# Patient Record
Sex: Female | Born: 1935
Health system: Southern US, Community
[De-identification: ages and names within clinical notes are randomized; demographics above are authoritative.]

## PROBLEM LIST (undated history)

## (undated) HISTORY — PX: OTHER SURGICAL HISTORY: SHX169

## (undated) HISTORY — PX: FRACTURE SURGERY: SHX138

---

## 2017-10-10 ENCOUNTER — Ambulatory Visit: Payer: Self-pay | Admitting: Family Medicine

## 2017-10-10 ENCOUNTER — Encounter: Payer: Self-pay | Admitting: Family Medicine

## 2017-10-10 VITALS — BP 110/80 | HR 85 | Ht 60.0 in | Wt 142.0 lb

## 2017-10-10 DIAGNOSIS — H6121 Impacted cerumen, right ear: Secondary | ICD-10-CM

## 2017-10-10 NOTE — Progress Notes (Signed)
Subjective:  Rachel Hughes is a 83 y.o. female who presents today with a chief complaint of earwax impaction and to establish care.   HPI:  Patient currently resides part time in [place]. She is visiting here in [place] with her daughter. Patient's husband recently passed away, and the patient will be spending more time in [place].  Ear Wax Impaction, New Issue Patient had evaluation for hearing aid last week. Was told that she had ear wax impaction that needed to be irrigated. Hearing has been stable over that time. She has had decreased hearing for several years. No home treatments tried. No clear precipitating events. No obvious alleviating or aggravating factors.   Nocturnal Cramps, New Issue Patient also with occasional nocturnal leg cramps. No clear precipitating factors. No home treatments tried.   ROS: Per HPI, otherwise a 14 point review of systems was performed and was negative  PMH:  The following were reviewed and entered/updated in epic: History reviewed. No pertinent past medical history. There are no active problems to display for this patient.  Past Surgical History:  Procedure Laterality Date  . CESAREAN SECTION    . Left Hip Surgery Left     Family History  Problem Relation Age of Onset  . Arthritis Daughter   . Arthritis Son   . Heart disease Son     Medications- reviewed and updated No current outpatient medications on file.   No current facility-administered medications for this visit.     Allergies-reviewed and updated No Known Allergies  Social History   Socioeconomic History  . Marital status: Widowed    Spouse name: None  . Number of children: 2  . Years of education: None  . Highest education level: None  Social Needs  . Financial resource strain: None  . Food insecurity - worry: None  . Food insecurity - inability: None  . Transportation needs - medical: None  . Transportation needs - non-medical: None  Occupational  History  . Occupation: Retired  Tobacco Use  . Smoking status: Never Smoker  . Smokeless tobacco: Never Used  Substance and Sexual Activity  . Alcohol use: No    Frequency: Never  . Drug use: No  . Sexual activity: No  Other Topics Concern  . None  Social History Narrative  . None   Objective:  Physical Exam: BP 110/80   Pulse 85   Ht 5' (1.524 m)   Wt 142 lb (64.4 kg)   SpO2 98%   BMI 27.73 kg/m   Gen: NAD, resting comfortably HEENT: Left TM clear. Right EAC with cerumen, after irrigation TM visualized without abnormality.  CV: RRR with no murmurs appreciated Pulm: NWOB, CTAB with no crackles, wheezes, or rhonchi GI: Normal bowel sounds present. Soft, Nontender, Nondistended. MSK: No edema, cyanosis, or clubbing noted Skin: Warm, dry Neuro: Grossly normal, moves all extremities Psych: Normal affect and thought content  Right EAC irrigated successfully by CMA. TM then visualized without abnormality.   Assessment/Plan:  Cerumen Impaction Successfully irrigated today. Advised patient to use hydrogen peroxide for 5-10 minutes at a time a few times a week to prevent recurrence. Patient will be going to having hearing checked again with possible hearing aid fit.   Nocturnal Cramps Likely related to dehydration. Offered laboratory testing today including CBC, CMET, and TSH, however patient and family declined. Encouraged good oral hydration.   Preventative Healthcare Flu shot deferred. Per daughter, patient recently had normal cholesterol panel in [place]. Will repeat again within  the next year.   Algis Greenhouse. Jerline Pain, MD 10/10/2017 3:37 PM

## 2019-06-21 ENCOUNTER — Emergency Department (HOSPITAL_COMMUNITY): Payer: Self-pay

## 2019-06-21 ENCOUNTER — Encounter (HOSPITAL_COMMUNITY): Payer: Self-pay | Admitting: Emergency Medicine

## 2019-06-21 ENCOUNTER — Inpatient Hospital Stay (HOSPITAL_COMMUNITY)
Admission: EM | Admit: 2019-06-21 | Discharge: 2019-07-01 | DRG: 418 | Disposition: A | Discharge status: Home Health | Payer: Self-pay | Attending: Internal Medicine | Admitting: Internal Medicine

## 2019-06-21 ENCOUNTER — Other Ambulatory Visit: Payer: Self-pay

## 2019-06-21 DIAGNOSIS — N390 Urinary tract infection, site not specified: Secondary | ICD-10-CM | POA: Diagnosis present

## 2019-06-21 DIAGNOSIS — R0789 Other chest pain: Secondary | ICD-10-CM

## 2019-06-21 DIAGNOSIS — H269 Unspecified cataract: Secondary | ICD-10-CM | POA: Diagnosis present

## 2019-06-21 DIAGNOSIS — K828 Other specified diseases of gallbladder: Secondary | ICD-10-CM

## 2019-06-21 DIAGNOSIS — H9190 Unspecified hearing loss, unspecified ear: Secondary | ICD-10-CM | POA: Diagnosis present

## 2019-06-21 DIAGNOSIS — K5900 Constipation, unspecified: Secondary | ICD-10-CM | POA: Diagnosis present

## 2019-06-21 DIAGNOSIS — I441 Atrioventricular block, second degree: Secondary | ICD-10-CM | POA: Diagnosis not present

## 2019-06-21 DIAGNOSIS — C23 Malignant neoplasm of gallbladder: Principal | ICD-10-CM | POA: Diagnosis present

## 2019-06-21 DIAGNOSIS — R109 Unspecified abdominal pain: Secondary | ICD-10-CM

## 2019-06-21 DIAGNOSIS — K8689 Other specified diseases of pancreas: Secondary | ICD-10-CM

## 2019-06-21 DIAGNOSIS — N6320 Unspecified lump in the left breast, unspecified quadrant: Secondary | ICD-10-CM

## 2019-06-21 DIAGNOSIS — C779 Secondary and unspecified malignant neoplasm of lymph node, unspecified: Secondary | ICD-10-CM | POA: Diagnosis present

## 2019-06-21 DIAGNOSIS — Z789 Other specified health status: Secondary | ICD-10-CM

## 2019-06-21 DIAGNOSIS — R1011 Right upper quadrant pain: Secondary | ICD-10-CM

## 2019-06-21 DIAGNOSIS — Z20828 Contact with and (suspected) exposure to other viral communicable diseases: Secondary | ICD-10-CM | POA: Diagnosis present

## 2019-06-21 DIAGNOSIS — K810 Acute cholecystitis: Secondary | ICD-10-CM

## 2019-06-21 DIAGNOSIS — Z8249 Family history of ischemic heart disease and other diseases of the circulatory system: Secondary | ICD-10-CM

## 2019-06-21 DIAGNOSIS — K8010 Calculus of gallbladder with chronic cholecystitis without obstruction: Secondary | ICD-10-CM | POA: Diagnosis present

## 2019-06-21 DIAGNOSIS — K219 Gastro-esophageal reflux disease without esophagitis: Secondary | ICD-10-CM | POA: Diagnosis present

## 2019-06-21 DIAGNOSIS — R739 Hyperglycemia, unspecified: Secondary | ICD-10-CM | POA: Diagnosis present

## 2019-06-21 DIAGNOSIS — E876 Hypokalemia: Secondary | ICD-10-CM | POA: Diagnosis not present

## 2019-06-21 DIAGNOSIS — D62 Acute posthemorrhagic anemia: Secondary | ICD-10-CM | POA: Diagnosis not present

## 2019-06-21 DIAGNOSIS — K7581 Nonalcoholic steatohepatitis (NASH): Secondary | ICD-10-CM | POA: Diagnosis present

## 2019-06-21 DIAGNOSIS — C50912 Malignant neoplasm of unspecified site of left female breast: Secondary | ICD-10-CM | POA: Diagnosis present

## 2019-06-21 DIAGNOSIS — C801 Malignant (primary) neoplasm, unspecified: Secondary | ICD-10-CM

## 2019-06-21 DIAGNOSIS — M159 Polyosteoarthritis, unspecified: Secondary | ICD-10-CM | POA: Diagnosis present

## 2019-06-21 DIAGNOSIS — J9811 Atelectasis: Secondary | ICD-10-CM | POA: Diagnosis not present

## 2019-06-21 LAB — COMPREHENSIVE METABOLIC PANEL
ALT: 17 U/L (ref 0–44)
AST: 25 U/L (ref 15–41)
Albumin: 4 g/dL (ref 3.5–5.0)
Alkaline Phosphatase: 66 U/L (ref 38–126)
Anion gap: 8 (ref 5–15)
BUN: 14 mg/dL (ref 8–23)
CO2: 26 mmol/L (ref 22–32)
Calcium: 9.1 mg/dL (ref 8.9–10.3)
Chloride: 103 mmol/L (ref 98–111)
Creatinine, Ser: 0.71 mg/dL (ref 0.44–1.00)
GFR calc Af Amer: >60 mL/min (ref >60)
GFR calc non Af Amer: >60 mL/min (ref >60)
Glucose, Bld: 157 mg/dL — ABNORMAL HIGH (ref 70–99)
Potassium: 3.7 mmol/L (ref 3.5–5.1)
Sodium: 137 mmol/L (ref 135–145)
Total Bilirubin: 0.7 mg/dL (ref 0.3–1.2)
Total Protein: 8.2 g/dL — ABNORMAL HIGH (ref 6.5–8.1)

## 2019-06-21 LAB — URINALYSIS, ROUTINE W REFLEX MICROSCOPIC
Bacteria, UA: NONE SEEN
Bilirubin Urine: NEGATIVE
Glucose, UA: NEGATIVE mg/dL
Ketones, ur: NEGATIVE mg/dL
Nitrite: NEGATIVE
Protein, ur: NEGATIVE mg/dL
Specific Gravity, Urine: 1.006 (ref 1.005–1.030)
pH: 6 (ref 5.0–8.0)

## 2019-06-21 LAB — CBC
HCT: 39.1 % (ref 36.0–46.0)
Hemoglobin: 12.6 g/dL (ref 12.0–15.0)
MCH: 30.1 pg (ref 26.0–34.0)
MCHC: 32.2 g/dL (ref 30.0–36.0)
MCV: 93.5 fL (ref 80.0–100.0)
Platelets: 236 10*3/uL (ref 150–400)
RBC: 4.18 MIL/uL (ref 3.87–5.11)
RDW: 12.9 % (ref 11.5–15.5)
WBC: 8.4 10*3/uL (ref 4.0–10.5)
nRBC: 0 % (ref 0.0–0.2)

## 2019-06-21 LAB — LIPASE, BLOOD: Lipase: 34 U/L (ref 11–51)

## 2019-06-21 MED ORDER — SODIUM CHLORIDE (PF) 0.9 % IJ SOLN
INTRAMUSCULAR | Status: AC
  Filled 2019-06-21: qty 50

## 2019-06-21 MED ORDER — SODIUM CHLORIDE 0.9 % IV BOLUS
500.0000 mL | Freq: Once | INTRAVENOUS | Status: AC
  Administered 2019-06-21: 500 mL via INTRAVENOUS

## 2019-06-21 MED ORDER — SODIUM CHLORIDE 0.9 % IV SOLN
INTRAVENOUS | Status: DC
  Administered 2019-06-22: 02:00:00 via INTRAVENOUS

## 2019-06-21 MED ORDER — IOHEXOL 300 MG/ML  SOLN
100.0000 mL | Freq: Once | INTRAMUSCULAR | Status: AC | PRN
  Administered 2019-06-21: 22:00:00 100 mL via INTRAVENOUS

## 2019-06-21 MED ORDER — ONDANSETRON HCL 4 MG/2ML IJ SOLN
4.0000 mg | Freq: Once | INTRAMUSCULAR | Status: AC
  Administered 2019-06-21: 4 mg via INTRAVENOUS
  Filled 2019-06-21: qty 2

## 2019-06-21 MED ORDER — FENTANYL CITRATE (PF) 100 MCG/2ML IJ SOLN
50.0000 ug | Freq: Once | INTRAMUSCULAR | Status: AC
  Administered 2019-06-21: 50 ug via INTRAVENOUS
  Filled 2019-06-21: qty 2

## 2019-06-21 MED ORDER — ACETAMINOPHEN 325 MG PO TABS
650.0000 mg | ORAL_TABLET | Freq: Once | ORAL | Status: DC
  Filled 2019-06-21: qty 2

## 2019-06-21 MED ORDER — ENOXAPARIN SODIUM 40 MG/0.4ML ~~LOC~~ SOLN
40.0000 mg | SUBCUTANEOUS | Status: DC
  Administered 2019-06-22 – 2019-06-28 (×7): 40 mg via SUBCUTANEOUS
  Filled 2019-06-21 (×7): qty 0.4

## 2019-06-21 MED ORDER — PIPERACILLIN-TAZOBACTAM 3.375 G IVPB 30 MIN
3.3750 g | Freq: Once | INTRAVENOUS | Status: AC
  Administered 2019-06-21: 3.375 g via INTRAVENOUS
  Filled 2019-06-21: qty 50

## 2019-06-21 NOTE — ED Triage Notes (Signed)
Pt c/o abd pains for 3-5 days. Daughter gave her tylenol and Prilosec. Pt hasnt had BM in 3 days or so. No vomiting, or urinary problems.  No [place], daughter interpreting.

## 2019-06-21 NOTE — ED Notes (Signed)
Unable to find pts language on translator, waiting for daughter to return to the ED.

## 2019-06-21 NOTE — ED Notes (Signed)
Pt states that her pain has decreased at the moment, when asked if she would like to hold the tylenol for pain the pt agreed. I explained to the pt if the pain returns to contact us with the call bell and we would arrange something for her.

## 2019-06-21 NOTE — ED Provider Notes (Addendum)
[facility] DEPT Provider Note   CSN: [id number] Arrival date & time: 06/21/19  1232     History   Chief Complaint Chief Complaint  Patient presents with   Abdominal Pain    HPI Rachel Hughes is a 85 y.o. female.     HPI  85 year old female comes in a chief complaint of abdominal pain.  She has no medical history.  She reports that she has been having abdominal discomfort that is fairly constant now for the last 3 to 4 days.  The pain is located primarily in the left upper quadrant.  Her pain is worse with movement.  She also now has nausea without vomiting, anorexia.  There is no UTI-like symptoms, no diarrhea.  Patient has no history of similar pain.  Review of system is also positive for chills, diaphoresis.  History reviewed. No pertinent past medical history.  There are no active problems to display for this patient.   Past Surgical History:  Procedure Laterality Date   CESAREAN SECTION     FRACTURE SURGERY     Left Hip Surgery Left      OB History   No obstetric history on file.      Home Medications    Prior to Admission medications   Medication Sig Start Date End Date Taking? Authorizing Provider  acetaminophen (TYLENOL) 500 MG tablet Take 500-1,000 mg by mouth every 6 (six) hours as needed for mild pain or moderate pain.   Yes [provider]  Calcium Carbonate-Vitamin D (CALCIUM 600+D HIGH POTENCY) 600-400 MG-UNIT tablet Take 1 tablet by mouth daily.   Yes [provider]  Multiple Vitamin (MULTIVITAMIN WITH MINERALS) TABS tablet Take 1 tablet by mouth daily.   Yes [provider]  omeprazole (PRILOSEC OTC) 20 MG tablet Take 20 mg by mouth daily.   Yes [provider]    Family History Family History  Problem Relation Age of Onset   Arthritis Daughter    Arthritis Son    Heart disease Son     Social History Social History   Tobacco Use   Smoking status: Never Smoker    Smokeless tobacco: Never Used  Substance Use Topics   Alcohol use: No    Frequency: Never   Drug use: No     Allergies   Patient has no known allergies.   Review of Systems Review of Systems  Constitutional: Positive for activity change, chills and fatigue. Negative for fever.  Respiratory: Negative for shortness of breath.   Cardiovascular: Negative for chest pain.  Gastrointestinal: Positive for abdominal pain, nausea and vomiting.  Allergic/Immunologic: Negative for immunocompromised state.  Neurological: Positive for weakness.  Hematological: Does not bruise/bleed easily.  All other systems reviewed and are negative.    Physical Exam Updated Vital Signs BP 135/86    Pulse 80    Temp 99.3 F (37.4 C) (Oral)    Resp 16    SpO2 98%   Physical Exam Vitals signs and nursing note reviewed.  Constitutional:      Appearance: She is well-developed.  HENT:     Head: Atraumatic.  Neck:     Musculoskeletal: Normal range of motion and neck supple.  Cardiovascular:     Rate and Rhythm: Normal rate.  Pulmonary:     Effort: Pulmonary effort is normal.  Abdominal:     General: Bowel sounds are normal.     Tenderness: There is generalized abdominal tenderness. There is guarding. There is no  rebound. Negative signs include Murphy's sign and McBurney's sign.     Comments: Generalized tenderness over the abdomen.  Patient in particular has guarding over the left upper quadrant and epigastric region.  Negative Murphy's, negative McBurney's.  Skin:    General: Skin is warm and dry.  Neurological:     Mental Status: She is alert and oriented to person, place, and time.      ED Treatments / Results  Labs (all labs ordered are listed, but only abnormal results are displayed) Labs Reviewed  COMPREHENSIVE METABOLIC PANEL - Abnormal; Notable for the following components:      Result Value   Glucose, Bld 157 (*)    Total Protein 8.2 (*)    All other components within normal  limits  URINALYSIS, ROUTINE W REFLEX MICROSCOPIC - Abnormal; Notable for the following components:   Color, Urine STRAW (*)    Hgb urine dipstick SMALL (*)    Leukocytes,Ua SMALL (*)    All other components within normal limits  SARS CORONAVIRUS 2 (HOSPITAL ORDER, [facility])  LIPASE, BLOOD  CBC    EKG None  Radiology Ct Abdomen Pelvis W Contrast  Result Date: 06/21/2019 CLINICAL DATA:  Abdominal pain.  Abscess suspected. EXAM: CT ABDOMEN AND PELVIS WITH CONTRAST TECHNIQUE: Multidetector CT imaging of the abdomen and pelvis was performed using the standard protocol following bolus administration of intravenous contrast. CONTRAST:  168mL OMNIPAQUE IOHEXOL 300 MG/ML  SOLN COMPARISON:  None. FINDINGS: Lower chest: The lung bases are clear. The heart size is normal. There are multiple masses that appear to be centered within the left breast tissue. The largest measures approximately 2.6 cm. Hepatobiliary: The liver is normal. The gallbladder is distended with some mild gallbladder wall thickening. There is likely gallbladder sludge.There is no biliary ductal dilation. Pancreas: Normal contours without ductal dilatation. No peripancreatic fluid collection. Spleen: No splenic laceration or hematoma. Adrenals/Urinary Tract: --Adrenal glands: No adrenal hemorrhage. --Right kidney/ureter: There is some mild cortical thinning of the upper pole the right kidney. --Left kidney/ureter: No hydronephrosis or perinephric hematoma. --Urinary bladder: Unremarkable. Stomach/Bowel: --Stomach/Duodenum: No hiatal hernia or other gastric abnormality. Normal duodenal course and caliber. --Small bowel: No dilatation or inflammation. --Colon: No focal abnormality. --Appendix: Not visualized. No right lower quadrant inflammation or free fluid. Vascular/Lymphatic: Atherosclerotic calcification is present within the non-aneurysmal abdominal aorta, without hemodynamically significant stenosis. --are no  pathologically enlarged retroperitoneal lymph nodes. There is a 3.2 cm heterogeneous mass adjacent to the pancreatic body and left hepatic lobe. There is an additional 2.5 cm heterogeneous mass adjacent to the pancreatic head. --No mesenteric lymphadenopathy. --No pelvic or inguinal lymphadenopathy. Reproductive: Unremarkable Other: No ascites or free air. The abdominal wall is normal. Musculoskeletal. There is a unilateral pars defect at L5 on the left without evidence for significant anterolisthesis. The patient is status post total hip arthroplasty on the left. IMPRESSION: 1. Distended gallbladder with gallbladder wall thickening raising concern for acute cholecystitis. Follow-up with ultrasound is recommended. 2. Multiple masses are noted in the left breast tissue. Findings are concerning for underlying breast carcinoma. Correlation with outpatient mammographies recommended. 3. A 3.2 cm and a 2.5 cm mass are noted adjacent to the pancreatic body and head. Findings are concerning for pathologically enlarged lymph nodes. Nodal metastatic disease is not excluded. Follow-up is recommended. Electronically Signed   By: Constance Holster M.D.   On: 06/21/2019 22:34    Procedures Procedures (including critical care time)  Medications Ordered in ED Medications  acetaminophen (TYLENOL) tablet 650 mg (650 mg Oral Not Given 06/21/19 1814)  sodium chloride (PF) 0.9 % injection (has no administration in time range)  piperacillin-tazobactam (ZOSYN) IVPB 3.375 g (has no administration in time range)  fentaNYL (SUBLIMAZE) injection 50 mcg (50 mcg Intravenous Given 06/21/19 1911)  ondansetron (ZOFRAN) injection 4 mg (4 mg Intravenous Given 06/21/19 1909)  sodium chloride 0.9 % bolus 500 mL (0 mLs Intravenous Stopped 06/21/19 2209)  iohexol (OMNIPAQUE) 300 MG/ML solution 100 mL (100 mLs Intravenous Contrast Given 06/21/19 2218)     Initial Impression / Assessment and Plan / ED Course  I have reviewed the triage  vital signs and the nursing notes.  Pertinent labs & imaging results that were available during my care of the patient were reviewed by me and considered in my medical decision making (see chart for details).      85 year old female comes in with chief complaint of abdominal pain.  She is been having symptoms for the last 3 or 4 days.  There is associated nausea, vomiting.  Abdominal pain is primarily located in the upper quadrants, and worst over the left upper quadrant.  She does not have any chest pain and there is no pleurisy.  Patient's family also reports that she is having nausea with anorexia, weakness, chills, diaphoresis.  She has no medical problems.  DDx includes: Pancreatitis Hepatobiliary pathology including cholecystitis Gastritis/PUD SBO ACS syndrome Aortic Dissection  CT scan ordered.  Results revealed that she likely has acute cholecystitis and also new and incidental finding of breast cancer along with tumors in the pancreas.  I have consulted general surgery.  Dr. Kieth Brightly will see the patient tomorrow.  Right upper quadrant ultrasound has been ordered given that she does not have Murphy's in the setting of the findings we have on CT scan.  Medicine will admit the patient. She will get Zosyn for her presumed cholecystitis. Patient is not septic.  I have discussed the CT scan findings with the daughter, who is on her way back to the hospital.  Translation service was not utilized at the request of patient and the daughter, and the daughter helped with translation.  Final Clinical Impressions(s) / ED Diagnoses   Final diagnoses:  RUQ pain  Acute cholecystitis  RUQ abdominal pain  Cancer [facility])    ED Discharge Orders    None       Varney Biles, MD 06/21/19 7948    Varney Biles, MD 06/21/19 2340

## 2019-06-21 NOTE — ED Notes (Signed)
Patient transported to CT 

## 2019-06-22 ENCOUNTER — Other Ambulatory Visit: Payer: Self-pay | Admitting: Hematology

## 2019-06-22 ENCOUNTER — Encounter (HOSPITAL_COMMUNITY): Payer: Self-pay | Admitting: Internal Medicine

## 2019-06-22 ENCOUNTER — Observation Stay (HOSPITAL_COMMUNITY): Payer: Self-pay

## 2019-06-22 DIAGNOSIS — N390 Urinary tract infection, site not specified: Secondary | ICD-10-CM

## 2019-06-22 DIAGNOSIS — N6320 Unspecified lump in the left breast, unspecified quadrant: Secondary | ICD-10-CM

## 2019-06-22 DIAGNOSIS — R1013 Epigastric pain: Secondary | ICD-10-CM

## 2019-06-22 DIAGNOSIS — K8689 Other specified diseases of pancreas: Secondary | ICD-10-CM

## 2019-06-22 DIAGNOSIS — K828 Other specified diseases of gallbladder: Secondary | ICD-10-CM

## 2019-06-22 DIAGNOSIS — R109 Unspecified abdominal pain: Secondary | ICD-10-CM

## 2019-06-22 DIAGNOSIS — R739 Hyperglycemia, unspecified: Secondary | ICD-10-CM

## 2019-06-22 LAB — CBC
HCT: 36.6 % (ref 36.0–46.0)
Hemoglobin: 11.5 g/dL — ABNORMAL LOW (ref 12.0–15.0)
MCH: 29.3 pg (ref 26.0–34.0)
MCHC: 31.4 g/dL (ref 30.0–36.0)
MCV: 93.4 fL (ref 80.0–100.0)
Platelets: 240 10*3/uL (ref 150–400)
RBC: 3.92 MIL/uL (ref 3.87–5.11)
RDW: 12.9 % (ref 11.5–15.5)
WBC: 7.7 10*3/uL (ref 4.0–10.5)
nRBC: 0 % (ref 0.0–0.2)

## 2019-06-22 LAB — PROTIME-INR
INR: 1.1 (ref 0.8–1.2)
Prothrombin Time: 13.7 seconds (ref 11.4–15.2)

## 2019-06-22 LAB — COMPREHENSIVE METABOLIC PANEL
ALT: 15 U/L (ref 0–44)
AST: 19 U/L (ref 15–41)
Albumin: 3.5 g/dL (ref 3.5–5.0)
Alkaline Phosphatase: 61 U/L (ref 38–126)
Anion gap: 8 (ref 5–15)
BUN: 12 mg/dL (ref 8–23)
CO2: 25 mmol/L (ref 22–32)
Calcium: 8.4 mg/dL — ABNORMAL LOW (ref 8.9–10.3)
Chloride: 104 mmol/L (ref 98–111)
Creatinine, Ser: 0.75 mg/dL (ref 0.44–1.00)
GFR calc Af Amer: >60 mL/min (ref >60)
GFR calc non Af Amer: >60 mL/min (ref >60)
Glucose, Bld: 123 mg/dL — ABNORMAL HIGH (ref 70–99)
Potassium: 3.5 mmol/L (ref 3.5–5.1)
Sodium: 137 mmol/L (ref 135–145)
Total Bilirubin: 1.1 mg/dL (ref 0.3–1.2)
Total Protein: 6.9 g/dL (ref 6.5–8.1)

## 2019-06-22 LAB — APTT: aPTT: 37 seconds — ABNORMAL HIGH (ref 24–36)

## 2019-06-22 LAB — LACTATE DEHYDROGENASE: LDH: 130 U/L (ref 98–192)

## 2019-06-22 LAB — HEMOGLOBIN A1C
Hgb A1c MFr Bld: 5.9 % — ABNORMAL HIGH (ref 4.8–5.6)
Mean Plasma Glucose: 122.63 mg/dL

## 2019-06-22 LAB — SARS CORONAVIRUS 2 BY RT PCR (HOSPITAL ORDER, PERFORMED IN ~~LOC~~ HOSPITAL LAB): SARS Coronavirus 2: NEGATIVE

## 2019-06-22 MED ORDER — POLYETHYLENE GLYCOL 3350 17 G PO PACK
17.0000 g | PACK | Freq: Every day | ORAL | Status: DC
  Administered 2019-06-22 – 2019-07-01 (×6): 17 g via ORAL
  Filled 2019-06-22 (×8): qty 1

## 2019-06-22 MED ORDER — DIPHENHYDRAMINE HCL 50 MG/ML IJ SOLN: 12.5000 mg | Freq: Four times a day (QID) | INTRAMUSCULAR | Status: DC | PRN

## 2019-06-22 MED ORDER — FENTANYL CITRATE (PF) 100 MCG/2ML IJ SOLN
25.0000 ug | INTRAMUSCULAR | Status: DC | PRN
  Administered 2019-06-24 – 2019-06-25 (×2): 25 ug via INTRAVENOUS
  Filled 2019-06-22 (×3): qty 2

## 2019-06-22 MED ORDER — HYDROMORPHONE HCL 1 MG/ML IJ SOLN
0.5000 mg | INTRAMUSCULAR | Status: DC | PRN
  Administered 2019-06-22: 02:00:00 0.5 mg via INTRAVENOUS
  Filled 2019-06-22: qty 0.5

## 2019-06-22 MED ORDER — PIPERACILLIN-TAZOBACTAM 3.375 G IVPB
3.3750 g | Freq: Three times a day (TID) | INTRAVENOUS | Status: DC
  Administered 2019-06-22: 3.375 g via INTRAVENOUS
  Filled 2019-06-22 (×2): qty 50

## 2019-06-22 MED ORDER — PANTOPRAZOLE SODIUM 40 MG IV SOLR
40.0000 mg | INTRAVENOUS | Status: DC
  Administered 2019-06-22: 08:00:00 40 mg via INTRAVENOUS
  Filled 2019-06-22: qty 40

## 2019-06-22 MED ORDER — FENTANYL CITRATE (PF) 100 MCG/2ML IJ SOLN
50.0000 ug | Freq: Once | INTRAMUSCULAR | Status: AC
  Administered 2019-06-22: 15:00:00 50 ug via INTRAVENOUS

## 2019-06-22 MED ORDER — DIPHENHYDRAMINE HCL 12.5 MG/5ML PO ELIX: 12.5000 mg | ORAL_SOLUTION | Freq: Four times a day (QID) | ORAL | Status: DC | PRN

## 2019-06-22 MED ORDER — FENTANYL CITRATE (PF) 100 MCG/2ML IJ SOLN
INTRAMUSCULAR | Status: AC
  Administered 2019-06-22: 15:00:00 50 ug via INTRAVENOUS
  Filled 2019-06-22: qty 2

## 2019-06-22 MED ORDER — ONDANSETRON HCL 4 MG/2ML IJ SOLN: 4.0000 mg | Freq: Four times a day (QID) | INTRAMUSCULAR | Status: DC | PRN

## 2019-06-22 MED ORDER — PANTOPRAZOLE SODIUM 40 MG PO TBEC
40.0000 mg | DELAYED_RELEASE_TABLET | Freq: Every day | ORAL | Status: DC
  Administered 2019-06-23 – 2019-07-01 (×9): 40 mg via ORAL
  Filled 2019-06-22 (×10): qty 1

## 2019-06-22 NOTE — Progress Notes (Signed)
Pt lying in bed with daughter sitting at bedside.  Pt resting quietly and no needs were voiced. Will continue monitor for pain.

## 2019-06-22 NOTE — Progress Notes (Signed)
  PROGRESS NOTE  Patient admitted earlier this morning. See H&P. Rachel Hughes is a 85 year old Mandarin speaking female with no significant past medical history presents with epigastric pain over the past 5 days.  She states that she has had some intermittent abdominal pain in the past, always resolved at home but this time it has persisted which prompted her to seek care in the hospital.  Had no other complaints on admission.  In the emergency department, CT abdomen pelvis revealed distended gallbladder with gallbladder wall thickening raising concern for acute cholecystitis, multiple masses in the left breast tissue, mass adjacent to pancreatic body and head.  General surgery was consulted.  Patient seen and examined with daughter at bedside who was able to translate.  Patient states that her abdominal pain has now resolved.  She has had no previous significant medical history, has not seen a physician since 2018.  She has never had a mammogram to her knowledge, if she had it was over 20 years ago likely.  Discussed with Dr. Alessandra Bevels.  He has reviewed scan with IR.  Patient's pancreas appears normal, IR recommended outpatient mammogram and ultrasound-guided biopsy of the left breast mass.  No further GI recommendations currently.  May pursue MRI abdomen with and without contrast with MRCP to follow-up on pancreas findings.  ?  Gallbladder, right upper quadrant ultrasound showed large amount of sludge within the gallbladder, borderline gallbladder wall thickness, no visible stones or Murphy sign.  Per general surgery.    Dessa Phi, DO [facility] [email or web address] 06/22/2019, 12:05 PM

## 2019-06-22 NOTE — Progress Notes (Signed)
Writer called to pt's room by her daughter to state that pt is having upper abdominal pain (right above belly button) after eating a bowl of cream of potato soup, and after returning for CT. The daughter states that the pt has this exact pain at home after eating, and has not been eating at home because of fear of the pain. Writer paged MD and received order for one time dose of pain medicine. Writer gave.

## 2019-06-22 NOTE — H&P (Addendum)
TRH H&P    Patient Demographics:    Rachel Hughes, is a 85 y.o. female  MRN: [id number]  DOB - 05/14/1933  Admit Date - 06/21/2019  Referring MD/NP/PA:    Outpatient Primary MD for the patient is Vivi Barrack, MD  Patient coming from: home  Chief complaint- epigastric pain   HPI:    Rachel Hughes  is a 85 y.o. female, w no significant pmhx presents with c/o epigastric pain intermittent but worsening over the past 5 days,  Pt has had slight wt loss over the past several months.  Pt notes slight nausea, but denies fever, chills, cough, cp, palp, sob, emesis, diarrhea, brbpr, black stool, dysuria, hematuria.   In ED,  Lipase 34 Na 137, K 3.7, Bun 14, Creatinine 0.71 Ast 25, Alt 17, Alk phos 66 T. Bili 0.7 Wbc 8.4, Hgb 12.6, Plt 236  Urinalysis wbc 6-10, rbc 0-5  CT scan abd/ pelvis IMPRESSION: 1. Distended gallbladder with gallbladder wall thickening raising concern for acute cholecystitis. Follow-up with ultrasound is recommended. 2. Multiple masses are noted in the left breast tissue. Findings are concerning for underlying breast carcinoma. Correlation with outpatient mammographies recommended. 3. A 3.2 cm and a 2.5 cm mass are noted adjacent to the pancreatic body and head. Findings are concerning for pathologically enlarged lymph nodes. Nodal metastatic disease is not excluded. Follow-up is recommended.  RUQ ultrasound IMPRESSION: Large amount of sludge within the gallbladder with borderline gallbladder wall thickness. No visible stones or sonographic Murphy sign.   Pt received zosyn iv in ED at 0030  Pt will be admitted for epigastric pain, and ? Peripancreatic mass and also masses in the left breast tissue.     Review of systems:    In addition to the HPI above,  No Fever-chills, No Headache, No changes with Vision or hearing, No problems swallowing food or Liquids, No Chest pain,  Cough or Shortness of Breath, No  Vomiting, bowel movements are regular, No Blood in stool or Urine, No dysuria, No new skin rashes or bruises, No new joints pains-aches,  No new weakness, tingling, numbness in any extremity, No recent weight gain or loss, No polyuria, polydypsia or polyphagia, No significant Mental Stressors.  All other systems reviewed and are negative.    Past History of the following :    History reviewed. No pertinent past medical history.    Past Surgical History:  Procedure Laterality Date  . CESAREAN SECTION    . FRACTURE SURGERY    . Left Hip Surgery Left       Social History:      Social History   Tobacco Use  . Smoking status: Never Smoker  . Smokeless tobacco: Never Used  Substance Use Topics  . Alcohol use: No    Frequency: Never       Family History :     Family History  Problem Relation Age of Onset  . Arthritis Daughter   . Arthritis Son   . Heart disease Son  Home Medications:   Prior to Admission medications   Medication Sig Start Date End Date Taking? Authorizing Provider  acetaminophen (TYLENOL) 500 MG tablet Take 500-1,000 mg by mouth every 6 (six) hours as needed for mild pain or moderate pain.   Yes [provider]  Calcium Carbonate-Vitamin D (CALCIUM 600+D HIGH POTENCY) 600-400 MG-UNIT tablet Take 1 tablet by mouth daily.   Yes [provider]  Multiple Vitamin (MULTIVITAMIN WITH MINERALS) TABS tablet Take 1 tablet by mouth daily.   Yes [provider]  omeprazole (PRILOSEC OTC) 20 MG tablet Take 20 mg by mouth daily.   Yes [provider]     Allergies:    No Known Allergies   Physical Exam:   Vitals  Blood pressure 108/62, pulse 79, temperature 99.3 F (37.4 C), temperature source Oral, resp. rate 16, SpO2 96 %.  1.  General: axoxo3  2. Psychiatric: euthymic  3. Neurologic: cn2-12 intact, reflexes 2+ symmetric, diffuse with no clonus, motor 5/5 in all  4 ext  4. HEENMT:  Anicteric, pupils 1.68m symmetric, direct, consensual, near intact Neck: no jvd, no bruit, no tm  5. Respiratory : CTAB  6. Cardiovascular : rrr s1, s2, no m/g/r  7. Gastrointestinal:  Abd: soft, nt, nd, +bs, negative murphy  8. Skin:  Ext: no c/c/e,  No rash  9.Musculoskeletal:  Good ROM  No cervical, no supraclavicular, no axillary , no epitrochlear lymph nodes palpated.  Pt declined breast exam    Data Review:    CBC Recent Labs  Lab 06/21/19 1322  WBC 8.4  HGB 12.6  HCT 39.1  PLT 236  MCV 93.5  MCH 30.1  MCHC 32.2  RDW 12.9   ------------------------------------------------------------------------------------------------------------------  Results for orders placed or performed during the hospital encounter of 06/21/19 (from the past 48 hour(s))  Lipase, blood     Status: None   Collection Time: 06/21/19  1:22 PM  Result Value Ref Range   Lipase 34 11 - 51 U/L    Comment: Performed at [facility] [address] [place] [id number] Comprehensive metabolic panel     Status: Abnormal   Collection Time: 06/21/19  1:22 PM  Result Value Ref Range   Sodium 137 135 - 145 mmol/L   Potassium 3.7 3.5 - 5.1 mmol/L   Chloride 103 98 - 111 mmol/L   CO2 26 22 - 32 mmol/L   Glucose, Bld 157 (H) 70 - 99 mg/dL   BUN 14 8 - 23 mg/dL   Creatinine, Ser 0.71 0.44 - 1.00 mg/dL   Calcium 9.1 8.9 - 10.3 mg/dL   Total Protein 8.2 (H) 6.5 - 8.1 g/dL   Albumin 4.0 3.5 - 5.0 g/dL   AST 25 15 - 41 U/L   ALT 17 0 - 44 U/L   Alkaline Phosphatase 66 38 - 126 U/L   Total Bilirubin 0.7 0.3 - 1.2 mg/dL   GFR calc non Af Amer >60 >60 mL/min   GFR calc Af Amer >60 >60 mL/min   Anion gap 8 5 - 15    Comment: Performed at [facility] [address], [place] [address] CBC     Status: None   Collection Time: 06/21/19  1:22 PM  Result Value Ref Range   WBC 8.4 4.0 - 10.5 K/uL   RBC 4.18 3.87 - 5.11  MIL/uL   Hemoglobin 12.6 12.0 - 15.0 g/dL   HCT 39.1 36.0 - 46.0 %  MCV 93.5 80.0 - 100.0 fL   MCH 30.1 26.0 - 34.0 pg   MCHC 32.2 30.0 - 36.0 g/dL   RDW 12.9 11.5 - 15.5 %   Platelets 236 150 - 400 K/uL   nRBC 0.0 0.0 - 0.2 %    Comment: Performed at [facility], [place] [address] [place], [place] [address]  Urinalysis, Routine w reflex microscopic     Status: Abnormal   Collection Time: 06/21/19  5:53 PM  Result Value Ref Range   Color, Urine STRAW (A) YELLOW   APPearance CLEAR CLEAR   Specific Gravity, Urine 1.006 1.005 - 1.030   pH 6.0 5.0 - 8.0   Glucose, UA NEGATIVE NEGATIVE mg/dL   Hgb urine dipstick SMALL (A) NEGATIVE   Bilirubin Urine NEGATIVE NEGATIVE   Ketones, ur NEGATIVE NEGATIVE mg/dL   Protein, ur NEGATIVE NEGATIVE mg/dL   Nitrite NEGATIVE NEGATIVE   Leukocytes,Ua SMALL (A) NEGATIVE   RBC / HPF 0-5 0 - 5 RBC/hpf   WBC, UA 6-10 0 - 5 WBC/hpf   Bacteria, UA NONE SEEN NONE SEEN   Squamous Epithelial / LPF 0-5 0 - 5    Comment: Performed at [facility], [address] [place], [place] [address]    Chemistries  Recent Labs  Lab 06/21/19 1322  NA 137  K 3.7  CL 103  CO2 26  GLUCOSE 157*  BUN 14  CREATININE 0.71  CALCIUM 9.1  AST 25  ALT 17  ALKPHOS 66  BILITOT 0.7   ------------------------------------------------------------------------------------------------------------------  ------------------------------------------------------------------------------------------------------------------ GFR: CrCl cannot be calculated (Unknown ideal weight.). Liver Function Tests: Recent Labs  Lab 06/21/19 1322  AST 25  ALT 17  ALKPHOS 66  BILITOT 0.7  PROT 8.2*  ALBUMIN 4.0   Recent Labs  Lab 06/21/19 1322  LIPASE 34   No results for input(s): AMMONIA in the last 168 hours. Coagulation Profile: No results for input(s): INR, PROTIME in the last 168 hours. Cardiac Enzymes: No results for input(s): CKTOTAL,  CKMB, CKMBINDEX, TROPONINI in the last 168 hours. BNP (last 3 results) No results for input(s): PROBNP in the last 8760 hours. HbA1C: No results for input(s): HGBA1C in the last 72 hours. CBG: No results for input(s): GLUCAP in the last 168 hours. Lipid Profile: No results for input(s): CHOL, HDL, LDLCALC, TRIG, CHOLHDL, LDLDIRECT in the last 72 hours. Thyroid Function Tests: No results for input(s): TSH, T4TOTAL, FREET4, T3FREE, THYROIDAB in the last 72 hours. Anemia Panel: No results for input(s): VITAMINB12, FOLATE, FERRITIN, TIBC, IRON, RETICCTPCT in the last 72 hours.  --------------------------------------------------------------------------------------------------------------- Urine analysis:    Component Value Date/Time   COLORURINE STRAW (A) 06/21/2019 1753   APPEARANCEUR CLEAR 06/21/2019 1753   LABSPEC 1.006 06/21/2019 1753   PHURINE 6.0 06/21/2019 1753   GLUCOSEU NEGATIVE 06/21/2019 1753   HGBUR SMALL (A) 06/21/2019 1753   BILIRUBINUR NEGATIVE 06/21/2019 1753   KETONESUR NEGATIVE 06/21/2019 1753   PROTEINUR NEGATIVE 06/21/2019 1753   NITRITE NEGATIVE 06/21/2019 1753   LEUKOCYTESUR SMALL (A) 06/21/2019 1753      Imaging Results:    Ct Abdomen Pelvis W Contrast  Result Date: 06/21/2019 CLINICAL DATA:  Abdominal pain.  Abscess suspected. EXAM: CT ABDOMEN AND PELVIS WITH CONTRAST TECHNIQUE: Multidetector CT imaging of the abdomen and pelvis was performed using the standard protocol following bolus administration of intravenous contrast. CONTRAST:  19m OMNIPAQUE IOHEXOL 300 MG/ML  SOLN COMPARISON:  None. FINDINGS: Lower chest: The lung bases are clear. The heart size is  normal. There are multiple masses that appear to be centered within the left breast tissue. The largest measures approximately 2.6 cm. Hepatobiliary: The liver is normal. The gallbladder is distended with some mild gallbladder wall thickening. There is likely gallbladder sludge.There is no biliary ductal  dilation. Pancreas: Normal contours without ductal dilatation. No peripancreatic fluid collection. Spleen: No splenic laceration or hematoma. Adrenals/Urinary Tract: --Adrenal glands: No adrenal hemorrhage. --Right kidney/ureter: There is some mild cortical thinning of the upper pole the right kidney. --Left kidney/ureter: No hydronephrosis or perinephric hematoma. --Urinary bladder: Unremarkable. Stomach/Bowel: --Stomach/Duodenum: No hiatal hernia or other gastric abnormality. Normal duodenal course and caliber. --Small bowel: No dilatation or inflammation. --Colon: No focal abnormality. --Appendix: Not visualized. No right lower quadrant inflammation or free fluid. Vascular/Lymphatic: Atherosclerotic calcification is present within the non-aneurysmal abdominal aorta, without hemodynamically significant stenosis. --are no pathologically enlarged retroperitoneal lymph nodes. There is a 3.2 cm heterogeneous mass adjacent to the pancreatic body and left hepatic lobe. There is an additional 2.5 cm heterogeneous mass adjacent to the pancreatic head. --No mesenteric lymphadenopathy. --No pelvic or inguinal lymphadenopathy. Reproductive: Unremarkable Other: No ascites or free air. The abdominal wall is normal. Musculoskeletal. There is a unilateral pars defect at L5 on the left without evidence for significant anterolisthesis. The patient is status post total hip arthroplasty on the left. IMPRESSION: 1. Distended gallbladder with gallbladder wall thickening raising concern for acute cholecystitis. Follow-up with ultrasound is recommended. 2. Multiple masses are noted in the left breast tissue. Findings are concerning for underlying breast carcinoma. Correlation with outpatient mammographies recommended. 3. A 3.2 cm and a 2.5 cm mass are noted adjacent to the pancreatic body and head. Findings are concerning for pathologically enlarged lymph nodes. Nodal metastatic disease is not excluded. Follow-up is recommended.  Electronically Signed   By: Constance Holster M.D.   On: 06/21/2019 22:34   US Abdomen Limited Ruq  Result Date: 06/21/2019 CLINICAL DATA:  Right upper quadrant pain EXAM: ULTRASOUND ABDOMEN LIMITED RIGHT UPPER QUADRANT COMPARISON:  None. FINDINGS: Gallbladder: Large amount of sludge within the gallbladder. Gallbladder wall is borderline thickened at 3.6 mm. Negative sonographic Murphy's. No visible stones. Common bile duct: Diameter: Normal caliber, 4 mm Liver: No focal lesion identified. Within normal limits in parenchymal echogenicity. Portal vein is patent on color Doppler imaging with normal direction of blood flow towards the liver. Other: None. IMPRESSION: Large amount of sludge within the gallbladder with borderline gallbladder wall thickness. No visible stones or sonographic Murphy sign. Electronically Signed   By: Rolm Baptise M.D.   On: 06/21/2019 23:54       Assessment & Plan:    Principal Problem:   Abdominal pain Active Problems:   Acute lower UTI   Hyperglycemia  Abdominal pain ? Gallbladder vs Peripancreatic mass Check LDH, check CA 19-9 NPO Ns iv Dilaudid 0.24m iv q4h prn  Surgery has been consulted by ED per ED, appreciate input  L breast tissue abnormality Defer to surgery in terms of w/up  Defer to  Nausea Zofran 455miv q6h prn   Gerd Protonix 4077mv qday  Acute lower uti Zosyn iv pharmacy to dose, to cover gallbladder and UTI  Hyperglycemia Check hga1c,   DVT Prophylaxis-   Lovenox - SCDs   AM Labs Ordered, also please review Full Orders  Family Communication: Admission, patients condition and plan of care including tests being ordered have been discussed with the patient and daughter who indicate understanding and agree with the plan and Code Status.  Code Status:  FULL CODE, daughter notified of patient admission   Admission status: Inpatient: Based on patients clinical presentation and evaluation of above clinical data, I have made  determination that patient meets Inpatient criteria at this time. Pt has high risk of clinical decline, pt will require > 2 nites stay to determine the etiology of abdominal pain.   Time spent in minutes : 55 minutes   Jani Gravel M.D on 06/22/2019 at 12:44 AM

## 2019-06-22 NOTE — Consult Note (Signed)
Referring Provider: Dr. Kieth Brightly Primary Care Physician:  Vivi Barrack, MD Primary Gastroenterologist: Althia Forts  Reason for Consultation: Pancreatic mass  HPI: Rachel Hughes is a 85 y.o. female who speaks Mandarin and admitted to the hospital for further evaluation of abdominal pain.  History obtained with interpreter software.  Complaining of constipation for last 4 days as well as epigastric and left upper quadrant abdominal discomfort.  Also complaining of increased burping.  Complaining of 15 to 20 pound weight loss.  Denies any blood in the stool or black stool.  Denies any nausea and vomiting.  Abdominal pain has improved significantly since admission.  GI is consulted for further evaluation of possible pancreatic mass.  Upon initial evaluation blood work was normal including CBC, CMP and lipase.  CT abdomen pelvis with IV contrast showed multiple left breast masses.  Also showed 3.2 cm and 2.5 cm lesions, probably pathologically enlarged lymph nodes around the pancreatic body and head.  Also showed distended gallbladder.  Follow-up ultrasound showed large amount of sludge within the gallbladder without any acute changes and minimal gallbladder wall thickness.  History reviewed. No pertinent past medical history.  Past Surgical History:  Procedure Laterality Date  . CESAREAN SECTION    . FRACTURE SURGERY    . Left Hip Surgery Left     Prior to Admission medications   Medication Sig Start Date End Date Taking? Authorizing Provider  acetaminophen (TYLENOL) 500 MG tablet Take 500-1,000 mg by mouth every 6 (six) hours as needed for mild pain or moderate pain.   Yes [provider]  Calcium Carbonate-Vitamin D (CALCIUM 600+D HIGH POTENCY) 600-400 MG-UNIT tablet Take 1 tablet by mouth daily.   Yes [provider]  Multiple Vitamin (MULTIVITAMIN WITH MINERALS) TABS tablet Take 1 tablet by mouth daily.   Yes [provider]  omeprazole (PRILOSEC OTC) 20 MG tablet  Take 20 mg by mouth daily.   Yes [provider]    Scheduled Meds: . acetaminophen  650 mg Oral Once  . enoxaparin (LOVENOX) injection  40 mg Subcutaneous Q24H  . pantoprazole (PROTONIX) IV  40 mg Intravenous Q24H   Continuous Infusions: . sodium chloride 75 mL/hr at 06/22/19 0146  . piperacillin-tazobactam (ZOSYN)  IV 3.375 g (06/22/19 0841)   PRN Meds:.diphenhydrAMINE, HYDROmorphone (DILAUDID) injection, ondansetron (ZOFRAN) IV  Allergies as of 06/21/2019  . (No Known Allergies)    Family History  Problem Relation Age of Onset  . Arthritis Daughter   . Arthritis Son   . Heart disease Son     Social History   Socioeconomic History  . Marital status: Widowed    Spouse name: Not on file  . Number of children: 2  . Years of education: Not on file  . Highest education level: Not on file  Occupational History  . Occupation: Retired  Scientific laboratory technician  . Financial resource strain: Not on file  . Food insecurity    Worry: Not on file    Inability: Not on file  . Transportation needs    Medical: Not on file    Non-medical: Not on file  Tobacco Use  . Smoking status: Never Smoker  . Smokeless tobacco: Never Used  Substance and Sexual Activity  . Alcohol use: No    Frequency: Never  . Drug use: No  . Sexual activity: Never  Lifestyle  . Physical activity    Days per week: Not on file    Minutes per session: Not on file  . Stress:  Not on file  Relationships  . Social Herbalist on phone: Not on file    Gets together: Not on file    Attends religious service: Not on file    Active member of club or organization: Not on file    Attends meetings of clubs or organizations: Not on file    Relationship status: Not on file  . Intimate partner violence    Fear of current or ex partner: Not on file    Emotionally abused: Not on file    Physically abused: Not on file    Forced sexual activity: Not on file  Other Topics Concern  . Not on file  Social  History Narrative  . Not on file    Review of Systems: Review of Systems  Constitutional: Positive for weight loss. Negative for chills and fever.  HENT: Positive for hearing loss. Negative for tinnitus.   Eyes: Negative for blurred vision.  Respiratory: Negative for cough and hemoptysis.   Cardiovascular: Negative for chest pain and palpitations.  Gastrointestinal: Positive for abdominal pain, constipation and nausea.  Musculoskeletal: Positive for joint pain and myalgias.  Skin: Negative for rash.  Psychiatric/Behavioral: Negative for substance abuse and suicidal ideas.    Physical Exam: Vital signs: Vitals:   06/22/19 0102 06/22/19 0457  BP: 117/76 (!) 104/57  Pulse: 80 74  Resp: 16 16  Temp: 98.8 F (37.1 C) 98.2 F (36.8 C)  SpO2: 97% 94%   Last BM Date: 06/19/19("3 days ago") Physical Exam  Constitutional: She is oriented to person, place, and time. She appears well-developed and well-nourished. No distress.  HENT:  Head: Normocephalic and atraumatic.  Mouth/Throat: Oropharynx is clear and moist. No oropharyngeal exudate.  Eyes: EOM are normal. Right eye exhibits no discharge. No scleral icterus.  Neck: Normal range of motion. Neck supple.  Cardiovascular: Normal rate, regular rhythm and normal heart sounds.  Pulmonary/Chest: Effort normal and breath sounds normal. No respiratory distress.  Abdominal: Soft. She exhibits no distension. There is abdominal tenderness. There is no rebound and no guarding.  Epigastric tenderness to palpation  Musculoskeletal: Normal range of motion.        General: No edema.  Neurological: She is alert and oriented to person, place, and time.  Skin: Skin is warm. No erythema.  Psychiatric: She has a normal mood and affect. Thought content normal.  Vitals reviewed.  GI:  Lab Results: Recent Labs    06/21/19 1322 06/22/19 0622  WBC 8.4 7.7  HGB 12.6 11.5*  HCT 39.1 36.6  PLT 236 240   BMET Recent Labs    06/21/19 1322  06/22/19 0622  NA 137 137  K 3.7 3.5  CL 103 104  CO2 26 25  GLUCOSE 157* 123*  BUN 14 12  CREATININE 0.71 0.75  CALCIUM 9.1 8.4*   LFT Recent Labs    06/22/19 0622  PROT 6.9  ALBUMIN 3.5  AST 19  ALT 15  ALKPHOS 61  BILITOT 1.1   PT/INR Recent Labs    06/22/19 0622  LABPROT 13.7  INR 1.1     Studies/Results: Ct Abdomen Pelvis W Contrast  Result Date: 06/21/2019 CLINICAL DATA:  Abdominal pain.  Abscess suspected. EXAM: CT ABDOMEN AND PELVIS WITH CONTRAST TECHNIQUE: Multidetector CT imaging of the abdomen and pelvis was performed using the standard protocol following bolus administration of intravenous contrast. CONTRAST:  146mL OMNIPAQUE IOHEXOL 300 MG/ML  SOLN COMPARISON:  None. FINDINGS: Lower chest: The lung bases are clear.  The heart size is normal. There are multiple masses that appear to be centered within the left breast tissue. The largest measures approximately 2.6 cm. Hepatobiliary: The liver is normal. The gallbladder is distended with some mild gallbladder wall thickening. There is likely gallbladder sludge.There is no biliary ductal dilation. Pancreas: Normal contours without ductal dilatation. No peripancreatic fluid collection. Spleen: No splenic laceration or hematoma. Adrenals/Urinary Tract: --Adrenal glands: No adrenal hemorrhage. --Right kidney/ureter: There is some mild cortical thinning of the upper pole the right kidney. --Left kidney/ureter: No hydronephrosis or perinephric hematoma. --Urinary bladder: Unremarkable. Stomach/Bowel: --Stomach/Duodenum: No hiatal hernia or other gastric abnormality. Normal duodenal course and caliber. --Small bowel: No dilatation or inflammation. --Colon: No focal abnormality. --Appendix: Not visualized. No right lower quadrant inflammation or free fluid. Vascular/Lymphatic: Atherosclerotic calcification is present within the non-aneurysmal abdominal aorta, without hemodynamically significant stenosis. --are no pathologically  enlarged retroperitoneal lymph nodes. There is a 3.2 cm heterogeneous mass adjacent to the pancreatic body and left hepatic lobe. There is an additional 2.5 cm heterogeneous mass adjacent to the pancreatic head. --No mesenteric lymphadenopathy. --No pelvic or inguinal lymphadenopathy. Reproductive: Unremarkable Other: No ascites or free air. The abdominal wall is normal. Musculoskeletal. There is a unilateral pars defect at L5 on the left without evidence for significant anterolisthesis. The patient is status post total hip arthroplasty on the left. IMPRESSION: 1. Distended gallbladder with gallbladder wall thickening raising concern for acute cholecystitis. Follow-up with ultrasound is recommended. 2. Multiple masses are noted in the left breast tissue. Findings are concerning for underlying breast carcinoma. Correlation with outpatient mammographies recommended. 3. A 3.2 cm and a 2.5 cm mass are noted adjacent to the pancreatic body and head. Findings are concerning for pathologically enlarged lymph nodes. Nodal metastatic disease is not excluded. Follow-up is recommended. Electronically Signed   By: Constance Holster M.D.   On: 06/21/2019 22:34   US Abdomen Limited Ruq  Result Date: 06/21/2019 CLINICAL DATA:  Right upper quadrant pain EXAM: ULTRASOUND ABDOMEN LIMITED RIGHT UPPER QUADRANT COMPARISON:  None. FINDINGS: Gallbladder: Large amount of sludge within the gallbladder. Gallbladder wall is borderline thickened at 3.6 mm. Negative sonographic Murphy's. No visible stones. Common bile duct: Diameter: Normal caliber, 4 mm Liver: No focal lesion identified. Within normal limits in parenchymal echogenicity. Portal vein is patent on color Doppler imaging with normal direction of blood flow towards the liver. Other: None. IMPRESSION: Large amount of sludge within the gallbladder with borderline gallbladder wall thickness. No visible stones or sonographic Murphy sign. Electronically Signed   By: Rolm Baptise  M.D.   On: 06/21/2019 23:54    Impression/Plan: -Abnormal CT scan concerning for left breast mass as well as peripancreatic lymphadenopathy.  Findings concerning for metastatic breast cancer. -Abdominal pain.  Could be from constipation.  Symptoms are improving. -Weight loss.  Recommendations ----------------------- -CT scan images reviewed.  CT scan discussed with interventional radiologist Dr. Annamaria Boots Patient's pancreas appears normal.  Dr. Annamaria Boots recommended outpatient mammogram and  ultrasound guided biopsy of the left breast mass.  Patient with normal LFTs.  Normal lipase.  -There is ongoing concern for pancreatic malignancy, recommend MRI abdomen with and without contrast with MRCP   -Discussed with Dr. Kieth Brightly.  -No further inpatient GI work-up planned.  GI will sign off.  Call us back if needed.   LOS: 1 day   Otis Brace  MD, FACP 06/22/2019, 10:10 AM  Contact #  [phone]

## 2019-06-22 NOTE — Progress Notes (Signed)
Pharmacy Antibiotic Note  Rachel Hughes is a 85 y.o. female admitted on 06/21/2019 with Intra-abdominal infection and UTI.  Pharmacy has been consulted for zosyn dosing.  Plan: Zosyn 3.375g IV Q8H infused over 4hrs.      Temp (24hrs), Avg:98.8 F (37.1 C), Min:98.2 F (36.8 C), Max:99.3 F (37.4 C)  Recent Labs  Lab 06/21/19 1322  WBC 8.4  CREATININE 0.71    CrCl cannot be calculated (Unknown ideal weight.).    No Known Allergies  Antimicrobials this admission: Zosyn 06/22/2019 >>   Dose adjustments this admission: -  Microbiology results: -  Thank you for allowing pharmacy to be a part of this patient's care.  Rachel Hughes 06/22/2019 6:48 AM

## 2019-06-22 NOTE — Discharge Summary (Signed)
Physician Discharge Summary  Kerith Sherley Louderback KYH:[id number] DOB: 03/09/1933 DOA: 06/21/2019  PCP: Vivi Barrack, MD  Admit date: 06/21/2019 Discharge date: 06/22/2019  Admitted From: Home Disposition:  Home   Recommendations for Outpatient Follow-up:  1. Follow up with Oncology Dr. Burr Medico regarding further work up, plan of care, biopsy  2. Follow up with [facility] Surgery if continues to have gallbladder issues  3. CT chest ordered prior to discharge, please follow up on results   Discharge Condition: Stable CODE STATUS: Full  Diet recommendation: Regular diet   Brief/Interim Summary: Rachel Hughes is a 85 year old Mandarin speaking female with no significant past medical history presents with epigastric pain over the past 5 days.  She states that she has had some intermittent abdominal pain in the past, always resolved at home but this time it has persisted which prompted her to seek care in the hospital.  Had no other complaints on admission.  In the emergency department, CT abdomen pelvis revealed distended gallbladder with gallbladder wall thickening raising concern for acute cholecystitis, multiple masses in the left breast tissue, mass adjacent to pancreatic body and head.  General surgery was consulted, as well as GI, oncology.  GI and IR reviewed patient's imaging, patient's pancreas appeared normal.  IR recommended outpatient mammogram, ultrasound guided biopsy of left breast mass.  Pending work-up, may pursue MRI abdomen with and without contrast with MRCP per GI.  Oncology also evaluated patient, will have close follow-up as an outpatient with biopsy to be scheduled as an outpatient.  Discharge Diagnoses:  Principal Problem:   Abdominal pain Active Problems:   Gallbladder sludge   Pancreatic mass   Mass of breast, left        Discharge Instructions  Discharge Instructions    Call MD for:  difficulty breathing, headache or visual disturbances   Complete by: As directed     Call MD for:  extreme fatigue   Complete by: As directed    Call MD for:  persistant dizziness or light-headedness   Complete by: As directed    Call MD for:  persistant nausea and vomiting   Complete by: As directed    Call MD for:  severe uncontrolled pain   Complete by: As directed    Call MD for:  temperature >100.4   Complete by: As directed    Diet general   Complete by: As directed    Discharge instructions   Complete by: As directed    You were cared for by a hospitalist during your hospital stay. If you have any questions about your discharge medications or the care you received while you were in the hospital after you are discharged, you can call the unit and ask to speak with the hospitalist on call if the hospitalist that took care of you is not available. Once you are discharged, your primary care physician will handle any further medical issues. Please note that NO REFILLS for any discharge medications will be authorized once you are discharged, as it is imperative that you return to your primary care physician (or establish a relationship with a primary care physician if you do not have one) for your aftercare needs so that they can reassess your need for medications and monitor your lab values.   Increase activity slowly   Complete by: As directed      Allergies as of 06/22/2019   No Known Allergies     Medication List    TAKE these medications  acetaminophen 500 MG tablet Commonly known as: TYLENOL Take 500-1,000 mg by mouth every 6 (six) hours as needed for mild pain or moderate pain.   Calcium 600+D High Potency 600-400 MG-UNIT tablet Generic drug: Calcium Carbonate-Vitamin D Take 1 tablet by mouth daily.   multivitamin with minerals Tabs tablet Take 1 tablet by mouth daily.   omeprazole 20 MG tablet Commonly known as: PRILOSEC OTC Take 20 mg by mouth daily.      Follow-up Information    Truitt Merle, MD Follow up.   Specialties: Hematology,  Oncology Contact information: [place] [address] [phone]        Kinsinger, Arta Bruce, MD Follow up.   Specialty: General Surgery Why: As needed  Contact information: [place] [address] [phone]          No Known Allergies  Consultations:  General surgery  GI  Interventional radiology, curbside  Oncology   Procedures/Studies: Ct Abdomen Pelvis W Contrast  Result Date: 06/21/2019 CLINICAL DATA:  Abdominal pain.  Abscess suspected. EXAM: CT ABDOMEN AND PELVIS WITH CONTRAST TECHNIQUE: Multidetector CT imaging of the abdomen and pelvis was performed using the standard protocol following bolus administration of intravenous contrast. CONTRAST:  147mL OMNIPAQUE IOHEXOL 300 MG/ML  SOLN COMPARISON:  None. FINDINGS: Lower chest: The lung bases are clear. The heart size is normal. There are multiple masses that appear to be centered within the left breast tissue. The largest measures approximately 2.6 cm. Hepatobiliary: The liver is normal. The gallbladder is distended with some mild gallbladder wall thickening. There is likely gallbladder sludge.There is no biliary ductal dilation. Pancreas: Normal contours without ductal dilatation. No peripancreatic fluid collection. Spleen: No splenic laceration or hematoma. Adrenals/Urinary Tract: --Adrenal glands: No adrenal hemorrhage. --Right kidney/ureter: There is some mild cortical thinning of the upper pole the right kidney. --Left kidney/ureter: No hydronephrosis or perinephric hematoma. --Urinary bladder: Unremarkable. Stomach/Bowel: --Stomach/Duodenum: No hiatal hernia or other gastric abnormality. Normal duodenal course and caliber. --Small bowel: No dilatation or inflammation. --Colon: No focal abnormality. --Appendix: Not visualized. No right lower quadrant inflammation or free fluid. Vascular/Lymphatic: Atherosclerotic calcification is present within the non-aneurysmal  abdominal aorta, without hemodynamically significant stenosis. --are no pathologically enlarged retroperitoneal lymph nodes. There is a 3.2 cm heterogeneous mass adjacent to the pancreatic body and left hepatic lobe. There is an additional 2.5 cm heterogeneous mass adjacent to the pancreatic head. --No mesenteric lymphadenopathy. --No pelvic or inguinal lymphadenopathy. Reproductive: Unremarkable Other: No ascites or free air. The abdominal wall is normal. Musculoskeletal. There is a unilateral pars defect at L5 on the left without evidence for significant anterolisthesis. The patient is status post total hip arthroplasty on the left. IMPRESSION: 1. Distended gallbladder with gallbladder wall thickening raising concern for acute cholecystitis. Follow-up with ultrasound is recommended. 2. Multiple masses are noted in the left breast tissue. Findings are concerning for underlying breast carcinoma. Correlation with outpatient mammographies recommended. 3. A 3.2 cm and a 2.5 cm mass are noted adjacent to the pancreatic body and head. Findings are concerning for pathologically enlarged lymph nodes. Nodal metastatic disease is not excluded. Follow-up is recommended. Electronically Signed   By: Constance Holster M.D.   On: 06/21/2019 22:34   US Abdomen Limited Ruq  Result Date: 06/21/2019 CLINICAL DATA:  Right upper quadrant pain EXAM: ULTRASOUND ABDOMEN LIMITED RIGHT UPPER QUADRANT COMPARISON:  None. FINDINGS: Gallbladder: Large amount of sludge within the gallbladder. Gallbladder wall is borderline thickened at 3.6 mm. Negative  sonographic Murphy's. No visible stones. Common bile duct: Diameter: Normal caliber, 4 mm Liver: No focal lesion identified. Within normal limits in parenchymal echogenicity. Portal vein is patent on color Doppler imaging with normal direction of blood flow towards the liver. Other: None. IMPRESSION: Large amount of sludge within the gallbladder with borderline gallbladder wall thickness. No  visible stones or sonographic Murphy sign. Electronically Signed   By: Rolm Baptise M.D.   On: 06/21/2019 23:54       Discharge Exam: Vitals:   06/22/19 0457 06/22/19 1304  BP: (!) 104/57 102/70  Pulse: 74 82  Resp: 16 18  Temp: 98.2 F (36.8 C) 99.3 F (37.4 C)  SpO2: 94% 93%    General: Pt is alert, awake, not in acute distress Cardiovascular: RRR, S1/S2 +, no rubs, no gallops Respiratory: CTA bilaterally, no wheezing, no rhonchi Abdominal: Soft, NT, ND, bowel sounds + Extremities: no edema, no cyanosis    The results of significant diagnostics from this hospitalization (including imaging, microbiology, ancillary and laboratory) are listed below for reference.     Microbiology: Recent Results (from the past 240 hour(s))  SARS Coronavirus 2 [facility] order, Performed in [facility] hospital lab) Nasopharyngeal Nasopharyngeal Swab     Status: None   Collection Time: 06/21/19 10:56 PM   Specimen: Nasopharyngeal Swab  Result Value Ref Range Status   SARS Coronavirus 2 NEGATIVE NEGATIVE Final    Comment: (NOTE) If result is NEGATIVE SARS-CoV-2 target nucleic acids are NOT DETECTED. The SARS-CoV-2 RNA is generally detectable in upper and lower  respiratory specimens during the acute phase of infection. The lowest  concentration of SARS-CoV-2 viral copies this assay can detect is 250  copies / mL. A negative result does not preclude SARS-CoV-2 infection  and should not be used as the sole basis for treatment or other  patient management decisions.  A negative result may occur with  improper specimen collection / handling, submission of specimen other  than nasopharyngeal swab, presence of viral mutation(s) within the  areas targeted by this assay, and inadequate number of viral copies  (<250 copies / mL). A negative result must be combined with clinical  observations, patient history, and epidemiological information. If result is POSITIVE SARS-CoV-2 target nucleic acids  are DETECTED. The SARS-CoV-2 RNA is generally detectable in upper and lower  respiratory specimens dur ing the acute phase of infection.  Positive  results are indicative of active infection with SARS-CoV-2.  Clinical  correlation with patient history and other diagnostic information is  necessary to determine patient infection status.  Positive results do  not rule out bacterial infection or co-infection with other viruses. If result is PRESUMPTIVE POSTIVE SARS-CoV-2 nucleic acids MAY BE PRESENT.   A presumptive positive result was obtained on the submitted specimen  and confirmed on repeat testing.  While 2019 novel coronavirus  (SARS-CoV-2) nucleic acids may be present in the submitted sample  additional confirmatory testing may be necessary for epidemiological  and / or clinical management purposes  to differentiate between  SARS-CoV-2 and other Sarbecovirus currently known to infect humans.  If clinically indicated additional testing with an alternate test  methodology [phone]) is advised. The SARS-CoV-2 RNA is generally  detectable in upper and lower respiratory sp ecimens during the acute  phase of infection. The expected result is Negative. Fact Sheet for Patients:  StrictlyIdeas.no Fact Sheet for Healthcare Providers: BankingDealers.co.za This test is not yet approved or cleared by the [place] FDA and has been authorized for  detection and/or diagnosis of SARS-CoV-2 by FDA under an Emergency Use Authorization (EUA).  This EUA will remain in effect (meaning this test can be used) for the duration of the COVID-19 declaration under Section 564(b)(1) of the Act, 21 U.S.C. section 360bbb-3(b)(1), unless the authorization is terminated or revoked sooner. Performed at [facility], [place] [address], [place], [place] [address]      Labs: BNP (last 3 results) No results for input(s): BNP in the last 8760  hours. Basic Metabolic Panel: Recent Labs  Lab 06/21/19 1322 06/22/19 0622  NA 137 137  K 3.7 3.5  CL 103 104  CO2 26 25  GLUCOSE 157* 123*  BUN 14 12  CREATININE 0.71 0.75  CALCIUM 9.1 8.4*   Liver Function Tests: Recent Labs  Lab 06/21/19 1322 06/22/19 0622  AST 25 19  ALT 17 15  ALKPHOS 66 61  BILITOT 0.7 1.1  PROT 8.2* 6.9  ALBUMIN 4.0 3.5   Recent Labs  Lab 06/21/19 1322  LIPASE 34   No results for input(s): AMMONIA in the last 168 hours. CBC: Recent Labs  Lab 06/21/19 1322 06/22/19 0622  WBC 8.4 7.7  HGB 12.6 11.5*  HCT 39.1 36.6  MCV 93.5 93.4  PLT 236 240   Cardiac Enzymes: No results for input(s): CKTOTAL, CKMB, CKMBINDEX, TROPONINI in the last 168 hours. BNP: Invalid input(s): POCBNP CBG: No results for input(s): GLUCAP in the last 168 hours. D-Dimer No results for input(s): DDIMER in the last 72 hours. Hgb A1c Recent Labs    06/22/19 0622  HGBA1C 5.9*   Lipid Profile No results for input(s): CHOL, HDL, LDLCALC, TRIG, CHOLHDL, LDLDIRECT in the last 72 hours. Thyroid function studies No results for input(s): TSH, T4TOTAL, T3FREE, THYROIDAB in the last 72 hours.  Invalid input(s): FREET3 Anemia work up No results for input(s): VITAMINB12, FOLATE, FERRITIN, TIBC, IRON, RETICCTPCT in the last 72 hours. Urinalysis    Component Value Date/Time   COLORURINE STRAW (A) 06/21/2019 1753   APPEARANCEUR CLEAR 06/21/2019 1753   LABSPEC 1.006 06/21/2019 1753   PHURINE 6.0 06/21/2019 1753   GLUCOSEU NEGATIVE 06/21/2019 1753   HGBUR SMALL (A) 06/21/2019 1753   BILIRUBINUR NEGATIVE 06/21/2019 1753   KETONESUR NEGATIVE 06/21/2019 1753   PROTEINUR NEGATIVE 06/21/2019 1753   NITRITE NEGATIVE 06/21/2019 1753   LEUKOCYTESUR SMALL (A) 06/21/2019 1753   Sepsis Labs Invalid input(s): PROCALCITONIN,  WBC,  LACTICIDVEN Microbiology Recent Results (from the past 240 hour(s))  SARS Coronavirus 2 [facility] order, Performed in [facility] hospital lab)  Nasopharyngeal Nasopharyngeal Swab     Status: None   Collection Time: 06/21/19 10:56 PM   Specimen: Nasopharyngeal Swab  Result Value Ref Range Status   SARS Coronavirus 2 NEGATIVE NEGATIVE Final    Comment: (NOTE) If result is NEGATIVE SARS-CoV-2 target nucleic acids are NOT DETECTED. The SARS-CoV-2 RNA is generally detectable in upper and lower  respiratory specimens during the acute phase of infection. The lowest  concentration of SARS-CoV-2 viral copies this assay can detect is 250  copies / mL. A negative result does not preclude SARS-CoV-2 infection  and should not be used as the sole basis for treatment or other  patient management decisions.  A negative result may occur with  improper specimen collection / handling, submission of specimen other  than nasopharyngeal swab, presence of viral mutation(s) within the  areas targeted by this assay, and inadequate number of viral copies  (<250 copies / mL). A negative result must be  combined with clinical  observations, patient history, and epidemiological information. If result is POSITIVE SARS-CoV-2 target nucleic acids are DETECTED. The SARS-CoV-2 RNA is generally detectable in upper and lower  respiratory specimens dur ing the acute phase of infection.  Positive  results are indicative of active infection with SARS-CoV-2.  Clinical  correlation with patient history and other diagnostic information is  necessary to determine patient infection status.  Positive results do  not rule out bacterial infection or co-infection with other viruses. If result is PRESUMPTIVE POSTIVE SARS-CoV-2 nucleic acids MAY BE PRESENT.   A presumptive positive result was obtained on the submitted specimen  and confirmed on repeat testing.  While 2019 novel coronavirus  (SARS-CoV-2) nucleic acids may be present in the submitted sample  additional confirmatory testing may be necessary for epidemiological  and / or clinical management purposes  to  differentiate between  SARS-CoV-2 and other Sarbecovirus currently known to infect humans.  If clinically indicated additional testing with an alternate test  methodology [phone]) is advised. The SARS-CoV-2 RNA is generally  detectable in upper and lower respiratory sp ecimens during the acute  phase of infection. The expected result is Negative. Fact Sheet for Patients:  StrictlyIdeas.no Fact Sheet for Healthcare Providers: BankingDealers.co.za This test is not yet approved or cleared by the [place] FDA and has been authorized for detection and/or diagnosis of SARS-CoV-2 by FDA under an Emergency Use Authorization (EUA).  This EUA will remain in effect (meaning this test can be used) for the duration of the COVID-19 declaration under Section 564(b)(1) of the Act, 21 U.S.C. section 360bbb-3(b)(1), unless the authorization is terminated or revoked sooner. Performed at [facility], [place] [address], [place], [place] [address]      Patient was seen and examined on the day of discharge and was found to be in stable condition. Time coordinating discharge: 45 minutes including assessment and coordination of care, as well as examination of the patient.   SIGNED:  Dessa Phi, DO [facility] [email or web address] 06/22/2019, 2:13 PM

## 2019-06-22 NOTE — Progress Notes (Signed)
Writer called to pt's room by daughter and daughter of pt stated that pt slept well with pain medicine. After waking, she drank some water and ate a small amount of vanilla pudding and the abdominal pain (right above belly button) has now returned. Pt stating that she does not want to eat anymore. Writer alerted MD to this pain. Received orders to keep pt for the night and pain medicine PRN. At this time, pt and her daughter do not want the pt to have anymore pain medicine unless the pain gets worse. Will monitor.

## 2019-06-22 NOTE — Consult Note (Signed)
[place]  Telephone:([phone]) [place]  DOB: [birth year]  MR#: [id number]  CSN#: [id number]    Requesting Physician: [facility]  Patient Care Team: Vivi Barrack, MD as PCP - General (Family Medicine)  Reason for consult: left breast mass   History of present illness: 85 year old woman from [place], who speaks Mandarin, without significant past medical history, presented with worsening abdominal pain for 5 days.   She had a similar episodes of epigastric pain 1 month ago, which lasted about 3 days, and resolved spontaneously.  Since 5 days ago, she noticed epigastric pain with radiation to the left upper abdomen, moderate to severe, persistent, not related to diet or position, along with decreased appetite, and fatigue, and 1 day of 3 black BM, formed.  Her stool color became normal afterwards.  No nausea or vomiting.  Due to her persistent pain, she came to the ED yesterday.  CT abdomen pelvis with contrast showed distended gallbladder with gallbladder wall thickening, multiple left breast masses, and 2.5 and 3.2 cm masses adjacent to the pancreatic body and head, concerning for pathologically enlarged lymph nodes.  She was admitted for further work-up.  She was seen by general surgeon Dr. Georgiana Spinner this morning.   She previously had right breast surgery which was benign many years ago, no personal or family history of malignancy, especially breast cancer.  She has been very healthy, came from [place] to visit her daughter and her family last Christmas, and has not been able to go back to [place] due to the [place] pandemic.  MEDICAL HISTORY:  History reviewed. No pertinent past medical history.  SURGICAL HISTORY: Past Surgical History:  Procedure Laterality Date  . CESAREAN SECTION    . FRACTURE SURGERY    . Left Hip Surgery Left     SOCIAL HISTORY: Social History   Socioeconomic History  . Marital  status: Widowed    Spouse name: Not on file  . Number of children: 2  . Years of education: Not on file  . Highest education level: Not on file  Occupational History  . Occupation: Retired  Scientific laboratory technician  . Financial resource strain: Not on file  . Food insecurity    Worry: Not on file    Inability: Not on file  . Transportation needs    Medical: Not on file    Non-medical: Not on file  Tobacco Use  . Smoking status: Never Smoker  . Smokeless tobacco: Never Used  Substance and Sexual Activity  . Alcohol use: No    Frequency: Never  . Drug use: No  . Sexual activity: Never  Lifestyle  . Physical activity    Days per week: Not on file    Minutes per session: Not on file  . Stress: Not on file  Relationships  . Social Herbalist on phone: Not on file    Gets together: Not on file    Attends religious service: Not on file    Active member of club or organization: Not on file    Attends meetings of clubs or organizations: Not on file    Relationship status: Not on file  . Intimate partner violence    Fear of current or ex partner: Not on file    Emotionally abused: Not on file    Physically abused: Not on file    Forced sexual activity: Not on file  Other Topics Concern  .  Not on file  Social History Narrative  . Not on file    FAMILY HISTORY: Family History  Problem Relation Age of Onset  . Arthritis Daughter   . Arthritis Son   . Heart disease Son     ALLERGIES:  has No Known Allergies.  MEDICATIONS:  Current Facility-Administered Medications  Medication Dose Route Frequency Provider Last Rate Last Dose  . acetaminophen (TYLENOL) tablet 650 mg  650 mg Oral Once Nanavati, Ankit, MD      . diphenhydrAMINE (BENADRYL) 12.5 MG/5ML elixir 12.5 mg  12.5 mg Oral Q6H PRN Leodis Sias T, RPH       Or  . diphenhydrAMINE (BENADRYL) injection 12.5 mg  12.5 mg Intravenous Q6H PRN Eudelia Bunch, RPH      . diphenhydrAMINE (BENADRYL) injection 12.5 mg  12.5  mg Intravenous Q6H PRN Jani Gravel, MD      . enoxaparin (LOVENOX) injection 40 mg  40 mg Subcutaneous Q24H Jani Gravel, MD   40 mg at 06/22/19 1031  . ondansetron (ZOFRAN) injection 4 mg  4 mg Intravenous Q6H PRN Jani Gravel, MD      . Derrill Memo ON 06/23/2019] pantoprazole (PROTONIX) EC tablet 40 mg  40 mg Oral Daily Bell, Michelle T, RPH      . polyethylene glycol (MIRALAX / GLYCOLAX) packet 17 g  17 g Oral Daily Brahmbhatt, Parag, MD   17 g at 06/22/19 1101    REVIEW OF SYSTEMS:   Constitutional: Denies fevers, chills or abnormal night sweats Eyes: Denies blurriness of vision, double vision or watery eyes Ears, nose, mouth, throat, and face: Denies mucositis or sore throat Respiratory: Denies cough, dyspnea or wheezes Cardiovascular: Denies palpitation, chest discomfort or lower extremity swelling Gastrointestinal:  Denies nausea, heartburn or change in bowel habits Skin: Denies abnormal skin rashes Lymphatics: Denies new lymphadenopathy or easy bruising Neurological:Denies numbness, tingling or new weaknesses Behavioral/Psych: Mood is stable, no new changes  All other systems were reviewed with the patient and are negative.  PHYSICAL EXAMINATION: ECOG PERFORMANCE STATUS: 1 - Symptomatic but completely ambulatory  Vitals:   06/22/19 0457 06/22/19 1304  BP: (!) 104/57 102/70  Pulse: 74 82  Resp: 16 18  Temp: 98.2 F (36.8 C) 99.3 F (37.4 C)  SpO2: 94% 93%   Filed Weights   06/22/19 0500  Weight: 142 lb 3.2 oz (64.5 kg)    GENERAL:alert, no distress and comfortable SKIN: skin color, texture, turgor are normal, no rashes or significant lesions EYES: normal, conjunctiva are pink and non-injected, sclera clear NECK: supple, thyroid normal size, non-tender, without nodularity LYMPH:  no palpable lymphadenopathy in the cervical, axillary or inguinal LUNGS: clear to auscultation and percussion with normal breathing effort HEART: regular rate & rhythm and no murmurs and no lower  extremity edema ABDOMEN:abdomen soft, non-tender and normal bowel sounds Musculoskeletal:no cyanosis of digits and no clubbing  PSYCH: alert & oriented x 3 with fluent speech NEURO: no focal motor/sensory deficits BREAST: There are 2 masses around left nipple, measuring 2 to 3 cm, mild tenderness, skin change nipple discharge.  Right breast exam with negative, no axillary adenopathy.  LABORATORY DATA:  I have reviewed the data as listed Lab Results  Component Value Date   WBC 7.7 06/22/2019   HGB 11.5 (L) 06/22/2019   HCT 36.6 06/22/2019   MCV 93.4 06/22/2019   PLT 240 06/22/2019   Recent Labs    06/21/19 1322 06/22/19 0622  NA 137 137  K 3.7 3.5  CL 103  104  CO2 26 25  GLUCOSE 157* 123*  BUN 14 12  CREATININE 0.71 0.75  CALCIUM 9.1 8.4*  GFRNONAA >60 >60  GFRAA >60 >60  PROT 8.2* 6.9  ALBUMIN 4.0 3.5  AST 25 19  ALT 17 15  ALKPHOS 66 61  BILITOT 0.7 1.1    RADIOGRAPHIC STUDIES: I have personally reviewed the radiological images as listed and agreed with the findings in the report. Ct Abdomen Pelvis W Contrast  Result Date: 06/21/2019 CLINICAL DATA:  Abdominal pain.  Abscess suspected. EXAM: CT ABDOMEN AND PELVIS WITH CONTRAST TECHNIQUE: Multidetector CT imaging of the abdomen and pelvis was performed using the standard protocol following bolus administration of intravenous contrast. CONTRAST:  181mL OMNIPAQUE IOHEXOL 300 MG/ML  SOLN COMPARISON:  None. FINDINGS: Lower chest: The lung bases are clear. The heart size is normal. There are multiple masses that appear to be centered within the left breast tissue. The largest measures approximately 2.6 cm. Hepatobiliary: The liver is normal. The gallbladder is distended with some mild gallbladder wall thickening. There is likely gallbladder sludge.There is no biliary ductal dilation. Pancreas: Normal contours without ductal dilatation. No peripancreatic fluid collection. Spleen: No splenic laceration or hematoma. Adrenals/Urinary  Tract: --Adrenal glands: No adrenal hemorrhage. --Right kidney/ureter: There is some mild cortical thinning of the upper pole the right kidney. --Left kidney/ureter: No hydronephrosis or perinephric hematoma. --Urinary bladder: Unremarkable. Stomach/Bowel: --Stomach/Duodenum: No hiatal hernia or other gastric abnormality. Normal duodenal course and caliber. --Small bowel: No dilatation or inflammation. --Colon: No focal abnormality. --Appendix: Not visualized. No right lower quadrant inflammation or free fluid. Vascular/Lymphatic: Atherosclerotic calcification is present within the non-aneurysmal abdominal aorta, without hemodynamically significant stenosis. --are no pathologically enlarged retroperitoneal lymph nodes. There is a 3.2 cm heterogeneous mass adjacent to the pancreatic body and left hepatic lobe. There is an additional 2.5 cm heterogeneous mass adjacent to the pancreatic head. --No mesenteric lymphadenopathy. --No pelvic or inguinal lymphadenopathy. Reproductive: Unremarkable Other: No ascites or free air. The abdominal wall is normal. Musculoskeletal. There is a unilateral pars defect at L5 on the left without evidence for significant anterolisthesis. The patient is status post total hip arthroplasty on the left. IMPRESSION: 1. Distended gallbladder with gallbladder wall thickening raising concern for acute cholecystitis. Follow-up with ultrasound is recommended. 2. Multiple masses are noted in the left breast tissue. Findings are concerning for underlying breast carcinoma. Correlation with outpatient mammographies recommended. 3. A 3.2 cm and a 2.5 cm mass are noted adjacent to the pancreatic body and head. Findings are concerning for pathologically enlarged lymph nodes. Nodal metastatic disease is not excluded. Follow-up is recommended. Electronically Signed   By: Constance Holster M.D.   On: 06/21/2019 22:34   US Abdomen Limited Ruq  Result Date: 06/21/2019 CLINICAL DATA:  Right upper quadrant  pain EXAM: ULTRASOUND ABDOMEN LIMITED RIGHT UPPER QUADRANT COMPARISON:  None. FINDINGS: Gallbladder: Large amount of sludge within the gallbladder. Gallbladder wall is borderline thickened at 3.6 mm. Negative sonographic Murphy's. No visible stones. Common bile duct: Diameter: Normal caliber, 4 mm Liver: No focal lesion identified. Within normal limits in parenchymal echogenicity. Portal vein is patent on color Doppler imaging with normal direction of blood flow towards the liver. Other: None. IMPRESSION: Large amount of sludge within the gallbladder with borderline gallbladder wall thickness. No visible stones or sonographic Murphy sign. Electronically Signed   By: Rolm Baptise M.D.   On: 06/21/2019 23:54    ASSESSMENT & PLAN:  85 year old [place] woman, without significant past medical history,  presented with worsening epigastric pain for 5 days, along slightly decreased appetite, fatigue, and 7 pound weight loss in the past few months  1. Left breast masses, likely breast cancer 2.  Enlarged lymph nodes adjacent to the pancreatic body and head, concerning node metastasis 3.  Distended gallbladder, without stone or acute cholecystitis 4. Epigastric pain, secondary to #2 vs constipation vs gastric issue    Recommendations: -I discussed her CT scan, lab and physical exam findings with patient and her daughter in [place].  This is highly suspicious for metastatic cancer, probable breast cancer primary, although the pattern of nodal metastasis is not typical for breast cancer.  Given her epigastric pain, possible melena, GI primary cancer, such as pancreatic cancer or gastric cancer are also possible. It is possible she may have two primary cancer.  -Per Dr. Kieth Brightly, no surgery is planned at this point -pt has no insurance, not eligible for Medcaid due to her visa status.  To decrease her out-of-pocket cost, I recommend get a CT chest without contrast today, and discharge her voice.  I will schedule  her breast mammogram, ultrasound and biopsy within the next week, and see her back biopsy in my clinic.  -I spoke with Dr. Maylene Roes,    All questions were answered. The patient knows to call the clinic with any problems, questions or concerns.      Truitt Merle, MD 06/22/2019 1:50 PM

## 2019-06-22 NOTE — ED Notes (Signed)
ED TO INPATIENT HANDOFF REPORT  ED Nurse Name and Phone #: Barth Kirks, Rn  S Name/Age/Gender Rachel Hughes 85 y.o. female Room/Bed: WA24/WA24  Code Status   Code Status: Full Code  Home/SNF/Other Given to floor Patient oriented to: self, place, time and situation Is this baseline? Yes   Triage Complete: Triage complete  Chief Complaint Abdominal pain  Triage Note Pt c/o abd pains for 3-5 days. Daughter gave her tylenol and Prilosec. Pt hasnt had BM in 3 days or so. No vomiting, or urinary problems.  No [place], daughter interpreting.    Allergies No Known Allergies  Level of Care/Admitting Diagnosis ED Disposition    ED Disposition Condition Comment   Admit  Hospital Area: [facility] [[id number]]  Level of Care: Telemetry [5]  Admit to tele based on following criteria: Monitor for Ischemic changes  Covid Evaluation: Asymptomatic Screening Protocol (No Symptoms)  Diagnosis: Pancreatic mass [762831]  Admitting Physician: Jani Gravel [[id number]]  Attending Physician: Jani Gravel [phone]  Estimated length of stay: past midnight tomorrow  Certification:: I certify this patient will need inpatient services for at least 2 midnights  PT Class (Do Not Modify): Inpatient [101]  PT Acc Code (Do Not Modify): Private [1]       B Medical/Surgery History History reviewed. No pertinent past medical history. Past Surgical History:  Procedure Laterality Date  . CESAREAN SECTION    . FRACTURE SURGERY    . Left Hip Surgery Left      A IV Location/Drains/Wounds Patient Lines/Drains/Airways Status   Active Line/Drains/Airways    Name:   Placement date:   Placement time:   Site:   Days:   Peripheral IV 06/21/19 Left Antecubital   06/21/19    1907    Antecubital   1          Intake/Output Last 24 hours  Intake/Output Summary (Last 24 hours) at 06/22/2019 0020 Last data filed at 06/21/2019 2209 Gross per 24 hour  Intake 500 ml  Output -  Net 500 ml     Labs/Imaging Results for orders placed or performed during the hospital encounter of 06/21/19 (from the past 48 hour(s))  Lipase, blood     Status: None   Collection Time: 06/21/19  1:22 PM  Result Value Ref Range   Lipase 34 11 - 51 U/L    Comment: Performed at [facility], [place] [address], [place], [place] [address]  Comprehensive metabolic panel     Status: Abnormal   Collection Time: 06/21/19  1:22 PM  Result Value Ref Range   Sodium 137 135 - 145 mmol/L   Potassium 3.7 3.5 - 5.1 mmol/L   Chloride 103 98 - 111 mmol/L   CO2 26 22 - 32 mmol/L   Glucose, Bld 157 (H) 70 - 99 mg/dL   BUN 14 8 - 23 mg/dL   Creatinine, Ser 0.71 0.44 - 1.00 mg/dL   Calcium 9.1 8.9 - 10.3 mg/dL   Total Protein 8.2 (H) 6.5 - 8.1 g/dL   Albumin 4.0 3.5 - 5.0 g/dL   AST 25 15 - 41 U/L   ALT 17 0 - 44 U/L   Alkaline Phosphatase 66 38 - 126 U/L   Total Bilirubin 0.7 0.3 - 1.2 mg/dL   GFR calc non Af Amer >60 >60 mL/min   GFR calc Af Amer >60 >60 mL/min   Anion gap 8 5 - 15    Comment: Performed at [facility], [address] [place], [place] [address]  CBC     Status: None   Collection Time: 06/21/19  1:22 PM  Result Value Ref Range   WBC 8.4 4.0 - 10.5 K/uL   RBC 4.18 3.87 - 5.11 MIL/uL   Hemoglobin 12.6 12.0 - 15.0 g/dL   HCT 39.1 36.0 - 46.0 %   MCV 93.5 80.0 - 100.0 fL   MCH 30.1 26.0 - 34.0 pg   MCHC 32.2 30.0 - 36.0 g/dL   RDW 12.9 11.5 - 15.5 %   Platelets 236 150 - 400 K/uL   nRBC 0.0 0.0 - 0.2 %    Comment: Performed at [facility], [place] [address], [place], [place] [address]  Urinalysis, Routine w reflex microscopic     Status: Abnormal   Collection Time: 06/21/19  5:53 PM  Result Value Ref Range   Color, Urine STRAW (A) YELLOW   APPearance CLEAR CLEAR   Specific Gravity, Urine 1.006 1.005 - 1.030   pH 6.0 5.0 - 8.0   Glucose, UA NEGATIVE NEGATIVE mg/dL   Hgb urine dipstick SMALL (A) NEGATIVE   Bilirubin Urine NEGATIVE  NEGATIVE   Ketones, ur NEGATIVE NEGATIVE mg/dL   Protein, ur NEGATIVE NEGATIVE mg/dL   Nitrite NEGATIVE NEGATIVE   Leukocytes,Ua SMALL (A) NEGATIVE   RBC / HPF 0-5 0 - 5 RBC/hpf   WBC, UA 6-10 0 - 5 WBC/hpf   Bacteria, UA NONE SEEN NONE SEEN   Squamous Epithelial / LPF 0-5 0 - 5    Comment: Performed at [facility], [place] [address] [place], [place] [address]   Ct Abdomen Pelvis W Contrast  Result Date: 06/21/2019 CLINICAL DATA:  Abdominal pain.  Abscess suspected. EXAM: CT ABDOMEN AND PELVIS WITH CONTRAST TECHNIQUE: Multidetector CT imaging of the abdomen and pelvis was performed using the standard protocol following bolus administration of intravenous contrast. CONTRAST:  161mL OMNIPAQUE IOHEXOL 300 MG/ML  SOLN COMPARISON:  None. FINDINGS: Lower chest: The lung bases are clear. The heart size is normal. There are multiple masses that appear to be centered within the left breast tissue. The largest measures approximately 2.6 cm. Hepatobiliary: The liver is normal. The gallbladder is distended with some mild gallbladder wall thickening. There is likely gallbladder sludge.There is no biliary ductal dilation. Pancreas: Normal contours without ductal dilatation. No peripancreatic fluid collection. Spleen: No splenic laceration or hematoma. Adrenals/Urinary Tract: --Adrenal glands: No adrenal hemorrhage. --Right kidney/ureter: There is some mild cortical thinning of the upper pole the right kidney. --Left kidney/ureter: No hydronephrosis or perinephric hematoma. --Urinary bladder: Unremarkable. Stomach/Bowel: --Stomach/Duodenum: No hiatal hernia or other gastric abnormality. Normal duodenal course and caliber. --Small bowel: No dilatation or inflammation. --Colon: No focal abnormality. --Appendix: Not visualized. No right lower quadrant inflammation or free fluid. Vascular/Lymphatic: Atherosclerotic calcification is present within the non-aneurysmal abdominal aorta, without  hemodynamically significant stenosis. --are no pathologically enlarged retroperitoneal lymph nodes. There is a 3.2 cm heterogeneous mass adjacent to the pancreatic body and left hepatic lobe. There is an additional 2.5 cm heterogeneous mass adjacent to the pancreatic head. --No mesenteric lymphadenopathy. --No pelvic or inguinal lymphadenopathy. Reproductive: Unremarkable Other: No ascites or free air. The abdominal wall is normal. Musculoskeletal. There is a unilateral pars defect at L5 on the left without evidence for significant anterolisthesis. The patient is status post total hip arthroplasty on the left. IMPRESSION: 1. Distended gallbladder with gallbladder wall thickening raising concern for acute cholecystitis. Follow-up with ultrasound is recommended. 2. Multiple masses are  noted in the left breast tissue. Findings are concerning for underlying breast carcinoma. Correlation with outpatient mammographies recommended. 3. A 3.2 cm and a 2.5 cm mass are noted adjacent to the pancreatic body and head. Findings are concerning for pathologically enlarged lymph nodes. Nodal metastatic disease is not excluded. Follow-up is recommended. Electronically Signed   By: Constance Holster M.D.   On: 06/21/2019 22:34   US Abdomen Limited Ruq  Result Date: 06/21/2019 CLINICAL DATA:  Right upper quadrant pain EXAM: ULTRASOUND ABDOMEN LIMITED RIGHT UPPER QUADRANT COMPARISON:  None. FINDINGS: Gallbladder: Large amount of sludge within the gallbladder. Gallbladder wall is borderline thickened at 3.6 mm. Negative sonographic Murphy's. No visible stones. Common bile duct: Diameter: Normal caliber, 4 mm Liver: No focal lesion identified. Within normal limits in parenchymal echogenicity. Portal vein is patent on color Doppler imaging with normal direction of blood flow towards the liver. Other: None. IMPRESSION: Large amount of sludge within the gallbladder with borderline gallbladder wall thickness. No visible stones or  sonographic Murphy sign. Electronically Signed   By: Rolm Baptise M.D.   On: 06/21/2019 23:54    Pending Labs Unresulted Labs (From admission, onward)    Start     Ordered   06/28/19 0500  Creatinine, serum  (enoxaparin (LOVENOX)    CrCl >/= 30 ml/min)  Weekly,   R    Comments: while on enoxaparin therapy    06/21/19 2345   06/22/19 0500  Cancer antigen 19-9  Tomorrow morning,   R     06/21/19 2342   06/22/19 0500  Protime-INR  Tomorrow morning,   R     06/21/19 2342   06/22/19 0500  APTT  Tomorrow morning,   R     06/21/19 2342   06/22/19 0500  Lactate dehydrogenase  Tomorrow morning,   R     06/21/19 2342   06/22/19 0500  CBC  Tomorrow morning,   R     06/21/19 2342   06/22/19 0500  Comprehensive metabolic panel  Tomorrow morning,   R     06/21/19 2342   06/21/19 2256  SARS Coronavirus 2 [facility] order, Performed in [place] hospital lab) Nasopharyngeal Nasopharyngeal Swab  (Asymptomatic Patients Labs)  Once,   STAT    Question Answer Comment  Is this test for diagnosis or screening Screening   Symptomatic for COVID-19 as defined by CDC No   Hospitalized for COVID-19 No   Admitted to ICU for COVID-19 No   Previously tested for COVID-19 No   Resident in a congregate (group) care setting No   Employed in healthcare setting No   Pregnant No      06/21/19 2255          Vitals/Pain Today's Vitals   06/21/19 2000 06/21/19 2137 06/21/19 2138 06/22/19 0000  BP: 135/86   108/62  Pulse: 80   79  Resp: 16   16  Temp:      TempSrc:      SpO2: 98%   96%  PainSc:  6  6      Isolation Precautions No active isolations  Medications Medications  acetaminophen (TYLENOL) tablet 650 mg (650 mg Oral Not Given 06/21/19 1814)  sodium chloride (PF) 0.9 % injection (has no administration in time range)  enoxaparin (LOVENOX) injection 40 mg (has no administration in time range)  0.9 %  sodium chloride infusion (has no administration in time range)  fentaNYL (SUBLIMAZE) injection  50 mcg (50 mcg Intravenous Given 06/21/19 1911)  ondansetron [facility]) injection 4 mg (4 mg Intravenous Given 06/21/19 1909)  sodium chloride 0.9 % bolus 500 mL (0 mLs Intravenous Stopped 06/21/19 2209)  iohexol (OMNIPAQUE) 300 MG/ML solution 100 mL (100 mLs Intravenous Contrast Given 06/21/19 2218)  piperacillin-tazobactam (ZOSYN) IVPB 3.375 g (3.375 g Intravenous New Bag/Given 06/21/19 2344)    Mobility walks Low fall risk   Focused Assessments Abd   R Recommendations: See Admitting Provider Note  Report given to:   Additional Notes:  Pt speaks does not speak english.

## 2019-06-22 NOTE — H&P (Signed)
Reason for Consult: abdominal pain Referring Physician: Angelika Jerrett Codd is an 85 y.o. female.  HPI: (completed with video interpreter for mandarin) 85 yo female with 1 week of abdominal pain. Pain started in left side and then spread to her whole abdomen. She has had 3 major episodes one of which kept her up at night. She has some associated nausea. She has also had dark stools in the last week. Initially she thought it was an ulcer and was prescribed an antacid. She also notes 15lb weight loss in the last year.  At the end of the conversation she adamantly requested her daughter be able to be in the room for discussions of her treatment due to language barrier, hearing loss, cataracts, and age.  History reviewed. No pertinent past medical history.  Past Surgical History:  Procedure Laterality Date   CESAREAN SECTION     FRACTURE SURGERY     Left Hip Surgery Left     Family History  Problem Relation Age of Onset   Arthritis Daughter    Arthritis Son    Heart disease Son     Social History:  reports that she has never smoked. She has never used smokeless tobacco. She reports that she does not drink alcohol or use drugs.  Allergies: No Known Allergies  Medications: I have reviewed the patient's current medications.  Results for orders placed or performed during the hospital encounter of 06/21/19 (from the past 48 hour(s))  Lipase, blood     Status: None   Collection Time: 06/21/19  1:22 PM  Result Value Ref Range   Lipase 34 11 - 51 U/L    Comment: Performed at [facility], [place] [address], [place], [place] [address]  Comprehensive metabolic panel     Status: Abnormal   Collection Time: 06/21/19  1:22 PM  Result Value Ref Range   Sodium 137 135 - 145 mmol/L   Potassium 3.7 3.5 - 5.1 mmol/L   Chloride 103 98 - 111 mmol/L   CO2 26 22 - 32 mmol/L   Glucose, Bld 157 (H) 70 - 99 mg/dL   BUN 14 8 - 23 mg/dL   Creatinine, Ser 0.71 0.44 - 1.00  mg/dL   Calcium 9.1 8.9 - 10.3 mg/dL   Total Protein 8.2 (H) 6.5 - 8.1 g/dL   Albumin 4.0 3.5 - 5.0 g/dL   AST 25 15 - 41 U/L   ALT 17 0 - 44 U/L   Alkaline Phosphatase 66 38 - 126 U/L   Total Bilirubin 0.7 0.3 - 1.2 mg/dL   GFR calc non Af Amer >60 >60 mL/min   GFR calc Af Amer >60 >60 mL/min   Anion gap 8 5 - 15    Comment: Performed at [facility], [place] [address], [place], [place] [address]  CBC     Status: None   Collection Time: 06/21/19  1:22 PM  Result Value Ref Range   WBC 8.4 4.0 - 10.5 K/uL   RBC 4.18 3.87 - 5.11 MIL/uL   Hemoglobin 12.6 12.0 - 15.0 g/dL   HCT 39.1 36.0 - 46.0 %   MCV 93.5 80.0 - 100.0 fL   MCH 30.1 26.0 - 34.0 pg   MCHC 32.2 30.0 - 36.0 g/dL   RDW 12.9 11.5 - 15.5 %   Platelets 236 150 - 400 K/uL   nRBC 0.0 0.0 - 0.2 %    Comment: Performed at [facility],  [place] [address] [place], [place] [address]  Urinalysis, Routine w reflex microscopic     Status: Abnormal   Collection Time: 06/21/19  5:53 PM  Result Value Ref Range   Color, Urine STRAW (A) YELLOW   APPearance CLEAR CLEAR   Specific Gravity, Urine 1.006 1.005 - 1.030   pH 6.0 5.0 - 8.0   Glucose, UA NEGATIVE NEGATIVE mg/dL   Hgb urine dipstick SMALL (A) NEGATIVE   Bilirubin Urine NEGATIVE NEGATIVE   Ketones, ur NEGATIVE NEGATIVE mg/dL   Protein, ur NEGATIVE NEGATIVE mg/dL   Nitrite NEGATIVE NEGATIVE   Leukocytes,Ua SMALL (A) NEGATIVE   RBC / HPF 0-5 0 - 5 RBC/hpf   WBC, UA 6-10 0 - 5 WBC/hpf   Bacteria, UA NONE SEEN NONE SEEN   Squamous Epithelial / LPF 0-5 0 - 5    Comment: Performed at [facility], [place] [address] [place], [place] [address]  SARS Coronavirus 2 [facility] order, Performed in [facility] hospital lab) Nasopharyngeal Nasopharyngeal Swab     Status: None   Collection Time: 06/21/19 10:56 PM   Specimen: Nasopharyngeal Swab  Result Value Ref Range   SARS Coronavirus 2 NEGATIVE NEGATIVE    Comment: (NOTE) If result  is NEGATIVE SARS-CoV-2 target nucleic acids are NOT DETECTED. The SARS-CoV-2 RNA is generally detectable in upper and lower  respiratory specimens during the acute phase of infection. The lowest  concentration of SARS-CoV-2 viral copies this assay can detect is 250  copies / mL. A negative result does not preclude SARS-CoV-2 infection  and should not be used as the sole basis for treatment or other  patient management decisions.  A negative result may occur with  improper specimen collection / handling, submission of specimen other  than nasopharyngeal swab, presence of viral mutation(s) within the  areas targeted by this assay, and inadequate number of viral copies  (<250 copies / mL). A negative result must be combined with clinical  observations, patient history, and epidemiological information. If result is POSITIVE SARS-CoV-2 target nucleic acids are DETECTED. The SARS-CoV-2 RNA is generally detectable in upper and lower  respiratory specimens dur ing the acute phase of infection.  Positive  results are indicative of active infection with SARS-CoV-2.  Clinical  correlation with patient history and other diagnostic information is  necessary to determine patient infection status.  Positive results do  not rule out bacterial infection or co-infection with other viruses. If result is PRESUMPTIVE POSTIVE SARS-CoV-2 nucleic acids MAY BE PRESENT.   A presumptive positive result was obtained on the submitted specimen  and confirmed on repeat testing.  While 2019 novel coronavirus  (SARS-CoV-2) nucleic acids may be present in the submitted sample  additional confirmatory testing may be necessary for epidemiological  and / or clinical management purposes  to differentiate between  SARS-CoV-2 and other Sarbecovirus currently known to infect humans.  If clinically indicated additional testing with an alternate test  methodology [phone]) is advised. The SARS-CoV-2 RNA is generally    detectable in upper and lower respiratory sp ecimens during the acute  phase of infection. The expected result is Negative. Fact Sheet for Patients:  StrictlyIdeas.no Fact Sheet for Healthcare Providers: BankingDealers.co.za This test is not yet approved or cleared by the [place] FDA and has been authorized for detection and/or diagnosis of SARS-CoV-2 by FDA under an Emergency Use Authorization (EUA).  This EUA will remain in effect (meaning this test can be used) for the duration of the COVID-19 declaration  under Section 564(b)(1) of the Act, 21 U.S.C. section 360bbb-3(b)(1), unless the authorization is terminated or revoked sooner. Performed at [facility], [place] [address] [place], [place] [address]   CBC     Status: Abnormal   Collection Time: 06/22/19  6:22 AM  Result Value Ref Range   WBC 7.7 4.0 - 10.5 K/uL   RBC 3.92 3.87 - 5.11 MIL/uL   Hemoglobin 11.5 (L) 12.0 - 15.0 g/dL   HCT 36.6 36.0 - 46.0 %   MCV 93.4 80.0 - 100.0 fL   MCH 29.3 26.0 - 34.0 pg   MCHC 31.4 30.0 - 36.0 g/dL   RDW 12.9 11.5 - 15.5 %   Platelets 240 150 - 400 K/uL   nRBC 0.0 0.0 - 0.2 %    Comment: Performed at [facility], [place] [address] [place],  [address]    Ct Abdomen Pelvis W Contrast  Result Date: 06/21/2019 CLINICAL DATA:  Abdominal pain.  Abscess suspected. EXAM: CT ABDOMEN AND PELVIS WITH CONTRAST TECHNIQUE: Multidetector CT imaging of the abdomen and pelvis was performed using the standard protocol following bolus administration of intravenous contrast. CONTRAST:  194mL OMNIPAQUE IOHEXOL 300 MG/ML  SOLN COMPARISON:  None. FINDINGS: Lower chest: The lung bases are clear. The heart size is normal. There are multiple masses that appear to be centered within the left breast tissue. The largest measures approximately 2.6 cm. Hepatobiliary: The liver is normal. The gallbladder is distended with some  mild gallbladder wall thickening. There is likely gallbladder sludge.There is no biliary ductal dilation. Pancreas: Normal contours without ductal dilatation. No peripancreatic fluid collection. Spleen: No splenic laceration or hematoma. Adrenals/Urinary Tract: --Adrenal glands: No adrenal hemorrhage. --Right kidney/ureter: There is some mild cortical thinning of the upper pole the right kidney. --Left kidney/ureter: No hydronephrosis or perinephric hematoma. --Urinary bladder: Unremarkable. Stomach/Bowel: --Stomach/Duodenum: No hiatal hernia or other gastric abnormality. Normal duodenal course and caliber. --Small bowel: No dilatation or inflammation. --Colon: No focal abnormality. --Appendix: Not visualized. No right lower quadrant inflammation or free fluid. Vascular/Lymphatic: Atherosclerotic calcification is present within the non-aneurysmal abdominal aorta, without hemodynamically significant stenosis. --are no pathologically enlarged retroperitoneal lymph nodes. There is a 3.2 cm heterogeneous mass adjacent to the pancreatic body and left hepatic lobe. There is an additional 2.5 cm heterogeneous mass adjacent to the pancreatic head. --No mesenteric lymphadenopathy. --No pelvic or inguinal lymphadenopathy. Reproductive: Unremarkable Other: No ascites or free air. The abdominal wall is normal. Musculoskeletal. There is a unilateral pars defect at L5 on the left without evidence for significant anterolisthesis. The patient is status post total hip arthroplasty on the left. IMPRESSION: 1. Distended gallbladder with gallbladder wall thickening raising concern for acute cholecystitis. Follow-up with ultrasound is recommended. 2. Multiple masses are noted in the left breast tissue. Findings are concerning for underlying breast carcinoma. Correlation with outpatient mammographies recommended. 3. A 3.2 cm and a 2.5 cm mass are noted adjacent to the pancreatic body and head. Findings are concerning for pathologically  enlarged lymph nodes. Nodal metastatic disease is not excluded. Follow-up is recommended. Electronically Signed   By: Constance Holster M.D.   On: 06/21/2019 22:34   US Abdomen Limited Ruq  Result Date: 06/21/2019 CLINICAL DATA:  Right upper quadrant pain EXAM: ULTRASOUND ABDOMEN LIMITED RIGHT UPPER QUADRANT COMPARISON:  None. FINDINGS: Gallbladder: Large amount of sludge within the gallbladder. Gallbladder wall is borderline thickened at 3.6 mm. Negative sonographic Murphy's. No visible stones. Common bile duct: Diameter: Normal caliber, 4 mm  Liver: No focal lesion identified. Within normal limits in parenchymal echogenicity. Portal vein is patent on color Doppler imaging with normal direction of blood flow towards the liver. Other: None. IMPRESSION: Large amount of sludge within the gallbladder with borderline gallbladder wall thickness. No visible stones or sonographic Murphy sign. Electronically Signed   By: Rolm Baptise M.D.   On: 06/21/2019 23:54    Review of Systems  Constitutional: Positive for weight loss. Negative for chills and fever.  HENT: Positive for hearing loss.   Eyes: Negative for blurred vision and double vision.  Respiratory: Negative for cough and hemoptysis.   Cardiovascular: Negative for chest pain and palpitations.  Gastrointestinal: Positive for abdominal pain, melena and nausea. Negative for vomiting.  Genitourinary: Negative for dysuria and urgency.  Musculoskeletal: Negative for myalgias and neck pain.  Skin: Negative for itching and rash.  Neurological: Negative for dizziness, tingling and headaches.  Endo/Heme/Allergies: Does not bruise/bleed easily.  Psychiatric/Behavioral: Negative for depression and suicidal ideas.   Blood pressure (!) 104/57, pulse 74, temperature 98.2 F (36.8 C), resp. rate 16, weight 64.5 kg, SpO2 94 %. Physical Exam  Vitals reviewed. Constitutional: She is oriented to person, place, and time. She appears well-developed and  well-nourished.  HENT:  Head: Normocephalic and atraumatic.  Eyes: Pupils are equal, round, and reactive to light. Conjunctivae and EOM are normal.  Neck: Normal range of motion. Neck supple.  Cardiovascular: Normal rate and regular rhythm.  Respiratory: Effort normal and breath sounds normal.  GI: Soft. Bowel sounds are normal. She exhibits no distension. There is no abdominal tenderness.  Musculoskeletal: Normal range of motion.  Neurological: She is alert and oriented to person, place, and time.  Skin: Skin is warm and dry.  Left breast with Palpable subareolar mass and one medial mass  Psychiatric: She has a normal mood and affect. Her behavior is normal.   Assessment/Plan: 85 yo female with abdominal pain and findings of pancreatic mass and left breast mass.  Pancreatic mass - we discussed workup, she was at first hesitant to work up due to her age and she did not think she was a candidate for surgery. She is agreeable to any diagnostic procedures though  Left breast mass - 2 palpable areas seen on CT scan  Gallbladder sludge - no signs of active inflammation by imaging or labs. Due to need for additional work up, we will hold off on surgery for her gallbladder  Language barrier - patient speaks mandarin only. Due to her age, gravity of her possible diagnosis and language barrier, I think it is a good idea to have her daughter at bedside to aide in discussions  Arta Bruce Florita Nitsch 06/22/2019, 7:27 AM

## 2019-06-23 LAB — CANCER ANTIGEN 19-9: CA 19-9: 9 U/mL (ref 0–35)

## 2019-06-23 MED ORDER — HYDROCODONE-ACETAMINOPHEN 5-325 MG PO TABS
1.0000 | ORAL_TABLET | Freq: Four times a day (QID) | ORAL | Status: DC | PRN
  Administered 2019-06-25: 2 via ORAL
  Administered 2019-06-27 (×2): 1 via ORAL
  Administered 2019-06-28 – 2019-06-29 (×2): 2 via ORAL
  Filled 2019-06-23: qty 1
  Filled 2019-06-23 (×5): qty 2
  Filled 2019-06-23: qty 1
  Filled 2019-06-23: qty 2

## 2019-06-23 MED ORDER — MAGNESIUM CITRATE PO SOLN
1.0000 | Freq: Once | ORAL | Status: AC
  Administered 2019-06-23: 1 via ORAL
  Filled 2019-06-23: qty 296

## 2019-06-23 NOTE — Progress Notes (Signed)
PROGRESS NOTE    Rachel Hughes  JKD:[id number] DOB: 1932/11/26 DOA: 06/21/2019 PCP: Vivi Barrack, MD     Brief Narrative:  Rachel Hughes is a 85 year old Mandarin speaking female with no significant past medical history presents with epigastric pain over the past 5 days.  She has had no previous significant medical history, has not seen a physician since 2018.  She has never had a mammogram to her knowledge, if she had it was over 20 years ago likely. She states that she has had some intermittent abdominal pain in the past, always resolved at home but this time it has persisted which prompted her to seek care in the hospital.  Had no other complaints on admission.  In the emergency department, CT abdomen pelvis revealed distended gallbladder with gallbladder wall thickening raising concern for acute cholecystitis, multiple masses in the left breast tissue, mass adjacent to pancreatic body and head.  General surgery was consulted as well as GI, oncology.   New events last 24 hours / Subjective: Plan was to discharge home yesterday after conferring with oncology.  However, patient started having postprandial abdominal pain that was severe in nature.  Discharge planning was held.  Patient seen with daughter at bedside who was able to translate.  Currently symptom-free, but has severe abdominal pain after eating pudding as well as congi which daughter brought from home, admitted to some chest tightness as well.  No nausea or vomiting.  Assessment & Plan:   Principal Problem:   Abdominal pain Active Problems:   Gallbladder sludge   Pancreatic mass   Mass of breast, left    Abdominal pain -?Constipation vs gallbladder etiology -RUQ [place]: Large amount of sludge within gallbladder with borderline gallbladder wall thickness, no visible stones -EKG reviewed independently today, normal sinus rhythm with PAC.  No ST changes -Patient complains of persistent postprandial pain -Possible cholecystectomy 8/3  depending on how patient does overnight  New breast mass, ?Pancreatic abnormality -CT abdomen pelvis revealed distended gallbladder with gallbladder wall thickening.  Multiple masses in the left breast tissue.  3.2 cm and 2.5 cm masses adjacent to the pancreatic body and head -CT chest revealed 2 adjacent rounded soft tissue masses in the left breast suspicious for breast cancer, no CT findings of metastatic disease involving chest/lung, no lytic or sclerotic bone lesion to suggest osseous metastatic disease -GI and IR reviewed patient's imaging, patient's pancreas appeared normal.  IR recommended outpatient mammogram, ultrasound guided biopsy of left breast mass.  Pending work-up, may pursue MRI abdomen with and without contrast with MRCP per GI.  Oncology also evaluated patient, will have close follow-up as an outpatient with biopsy to be scheduled as an outpatient.    DVT prophylaxis: Lovenox  Code Status: Full Family Communication: Daughter at bedside Disposition Plan: Pending clinical improvement   Consultants:   General surgery  GI  Oncology  Procedures:   None  Antimicrobials:  Anti-infectives (From admission, onward)   Start     Dose/Rate Route Frequency Ordered Stop   06/22/19 0600  piperacillin-tazobactam (ZOSYN) IVPB 3.375 g  Status:  Discontinued     3.375 g 12.5 mL/hr over 240 Minutes Intravenous Every 8 hours 06/22/19 0527 06/22/19 1211   06/21/19 2315  piperacillin-tazobactam (ZOSYN) IVPB 3.375 g     3.375 g 100 mL/hr over 30 Minutes Intravenous  Once 06/21/19 2306 06/22/19 0014        Objective: Vitals:   06/22/19 1304 06/22/19 1932 06/23/19 0635 06/23/19 0734  BP:  102/70 115/64 120/68   Pulse: 82 74 76   Resp: 18 18 18    Temp: 99.3 F (37.4 C) 98.1 F (36.7 C) 99.2 F (37.3 C)   TempSrc: Oral Oral Oral   SpO2: 93% 96% 95%   Weight:    62.7 kg    Intake/Output Summary (Last 24 hours) at 06/23/2019 1302 Last data filed at 06/23/2019 1100 Gross per  24 hour  Intake 360 ml  Output 2 ml  Net 358 ml   Filed Weights   06/22/19 0500 06/23/19 0734  Weight: 64.5 kg 62.7 kg    Examination:  General exam: Appears calm and comfortable  Respiratory system: Clear to auscultation. Respiratory effort normal. Cardiovascular system: S1 & S2 heard, RRR. No JVD, murmurs, rubs, gallops or clicks. No pedal edema. Gastrointestinal system: Abdomen is nondistended, soft and tender to palpation right upper quadrant and epigastric Central nervous system: Alert and oriented. No focal neurological deficits. Extremities: Symmetric 5 x 5 power. Skin: No rashes, lesions or ulcers Psychiatry: Judgement and insight appear normal. Mood & affect appropriate.   Data Reviewed: I have personally reviewed following labs and imaging studies  CBC: Recent Labs  Lab 06/21/19 1322 06/22/19 0622  WBC 8.4 7.7  HGB 12.6 11.5*  HCT 39.1 36.6  MCV 93.5 93.4  PLT 236 409   Basic Metabolic Panel: Recent Labs  Lab 06/21/19 1322 06/22/19 0622  NA 137 137  K 3.7 3.5  CL 103 104  CO2 26 25  GLUCOSE 157* 123*  BUN 14 12  CREATININE 0.71 0.75  CALCIUM 9.1 8.4*   GFR: CrCl cannot be calculated (Unknown ideal weight.). Liver Function Tests: Recent Labs  Lab 06/21/19 1322 06/22/19 0622  AST 25 19  ALT 17 15  ALKPHOS 66 61  BILITOT 0.7 1.1  PROT 8.2* 6.9  ALBUMIN 4.0 3.5   Recent Labs  Lab 06/21/19 1322  LIPASE 34   No results for input(s): AMMONIA in the last 168 hours. Coagulation Profile: Recent Labs  Lab 06/22/19 0622  INR 1.1   Cardiac Enzymes: No results for input(s): CKTOTAL, CKMB, CKMBINDEX, TROPONINI in the last 168 hours. BNP (last 3 results) No results for input(s): PROBNP in the last 8760 hours. HbA1C: Recent Labs    06/22/19 0622  HGBA1C 5.9*   CBG: No results for input(s): GLUCAP in the last 168 hours. Lipid Profile: No results for input(s): CHOL, HDL, LDLCALC, TRIG, CHOLHDL, LDLDIRECT in the last 72 hours. Thyroid  Function Tests: No results for input(s): TSH, T4TOTAL, FREET4, T3FREE, THYROIDAB in the last 72 hours. Anemia Panel: No results for input(s): VITAMINB12, FOLATE, FERRITIN, TIBC, IRON, RETICCTPCT in the last 72 hours. Sepsis Labs: No results for input(s): PROCALCITON, LATICACIDVEN in the last 168 hours.  Recent Results (from the past 240 hour(s))  SARS Coronavirus 2 [facility] order, Performed in [facility] hospital lab) Nasopharyngeal Nasopharyngeal Swab     Status: None   Collection Time: 06/21/19 10:56 PM   Specimen: Nasopharyngeal Swab  Result Value Ref Range Status   SARS Coronavirus 2 NEGATIVE NEGATIVE Final    Comment: (NOTE) If result is NEGATIVE SARS-CoV-2 target nucleic acids are NOT DETECTED. The SARS-CoV-2 RNA is generally detectable in upper and lower  respiratory specimens during the acute phase of infection. The lowest  concentration of SARS-CoV-2 viral copies this assay can detect is 250  copies / mL. A negative result does not preclude SARS-CoV-2 infection  and should not be used as the sole basis for treatment or  other  patient management decisions.  A negative result may occur with  improper specimen collection / handling, submission of specimen other  than nasopharyngeal swab, presence of viral mutation(s) within the  areas targeted by this assay, and inadequate number of viral copies  (<250 copies / mL). A negative result must be combined with clinical  observations, patient history, and epidemiological information. If result is POSITIVE SARS-CoV-2 target nucleic acids are DETECTED. The SARS-CoV-2 RNA is generally detectable in upper and lower  respiratory specimens dur ing the acute phase of infection.  Positive  results are indicative of active infection with SARS-CoV-2.  Clinical  correlation with patient history and other diagnostic information is  necessary to determine patient infection status.  Positive results do  not rule out bacterial infection or  co-infection with other viruses. If result is PRESUMPTIVE POSTIVE SARS-CoV-2 nucleic acids MAY BE PRESENT.   A presumptive positive result was obtained on the submitted specimen  and confirmed on repeat testing.  While 2019 novel coronavirus  (SARS-CoV-2) nucleic acids may be present in the submitted sample  additional confirmatory testing may be necessary for epidemiological  and / or clinical management purposes  to differentiate between  SARS-CoV-2 and other Sarbecovirus currently known to infect humans.  If clinically indicated additional testing with an alternate test  methodology [phone]) is advised. The SARS-CoV-2 RNA is generally  detectable in upper and lower respiratory sp ecimens during the acute  phase of infection. The expected result is Negative. Fact Sheet for Patients:  StrictlyIdeas.no Fact Sheet for Healthcare Providers: BankingDealers.co.za This test is not yet approved or cleared by the [place] FDA and has been authorized for detection and/or diagnosis of SARS-CoV-2 by FDA under an Emergency Use Authorization (EUA).  This EUA will remain in effect (meaning this test can be used) for the duration of the COVID-19 declaration under Section 564(b)(1) of the Act, 21 U.S.C. section 360bbb-3(b)(1), unless the authorization is terminated or revoked sooner. Performed at [facility], [place] [address], [place], [place] [address]       Radiology Studies: Ct Chest Wo Contrast  Result Date: 06/22/2019 CLINICAL DATA:  Metastatic breast cancer. EXAM: CT CHEST WITHOUT CONTRAST TECHNIQUE: Multidetector CT imaging of the chest was performed following the standard protocol without IV contrast. COMPARISON:  CT abdomen/pelvis from yesterday. FINDINGS: Cardiovascular: The heart is within normal limits in size. Small amount of pericardial fluid without overt effusion. There is mild tortuosity, ectasia and  calcification of the thoracic aorta. Remarkably little coronary artery calcifications for age. Mediastinum/Nodes: Small scattered mediastinal and hilar lymph nodes but no mass or overt adenopathy. Calcified left hilar lymph nodes are noted. The esophagus is grossly normal. Lungs/Pleura: No obvious pulmonary nodules to suggest pulmonary metastatic disease. No acute pulmonary findings. No pleural effusion. Upper Abdomen: Distended gallbladder again demonstrated along with periportal and celiac axis lymphadenopathy. Musculoskeletal: 2 adjacent rounded masses are noted in the left breast. Small scattered left axillary lymph nodes but no overt adenopathy. No subpectoral or supraclavicular adenopathy. The thyroid gland is grossly normal. No lytic or sclerotic bone lesions to suggest osseous metastatic disease. IMPRESSION: 1. Two adjacent rounded soft tissue masses in the left breast suspicious for breast cancer. No definite left axillary or supraclavicular adenopathy. 2. No CT findings for metastatic disease involving the chest/lungs. 3. Stable gallbladder enlargement and periportal and celiac axis lymphadenopathy. 4. No lytic or sclerotic bone lesions to suggest osseous metastatic disease. Aortic Atherosclerosis (ICD10-I70.0). Electronically Signed   By: Mamie Nick.  Gallerani M.D.   On: 06/22/2019 15:01   Ct Abdomen Pelvis W Contrast  Result Date: 06/21/2019 CLINICAL DATA:  Abdominal pain.  Abscess suspected. EXAM: CT ABDOMEN AND PELVIS WITH CONTRAST TECHNIQUE: Multidetector CT imaging of the abdomen and pelvis was performed using the standard protocol following bolus administration of intravenous contrast. CONTRAST:  174mL OMNIPAQUE IOHEXOL 300 MG/ML  SOLN COMPARISON:  None. FINDINGS: Lower chest: The lung bases are clear. The heart size is normal. There are multiple masses that appear to be centered within the left breast tissue. The largest measures approximately 2.6 cm. Hepatobiliary: The liver is normal. The gallbladder  is distended with some mild gallbladder wall thickening. There is likely gallbladder sludge.There is no biliary ductal dilation. Pancreas: Normal contours without ductal dilatation. No peripancreatic fluid collection. Spleen: No splenic laceration or hematoma. Adrenals/Urinary Tract: --Adrenal glands: No adrenal hemorrhage. --Right kidney/ureter: There is some mild cortical thinning of the upper pole the right kidney. --Left kidney/ureter: No hydronephrosis or perinephric hematoma. --Urinary bladder: Unremarkable. Stomach/Bowel: --Stomach/Duodenum: No hiatal hernia or other gastric abnormality. Normal duodenal course and caliber. --Small bowel: No dilatation or inflammation. --Colon: No focal abnormality. --Appendix: Not visualized. No right lower quadrant inflammation or free fluid. Vascular/Lymphatic: Atherosclerotic calcification is present within the non-aneurysmal abdominal aorta, without hemodynamically significant stenosis. --are no pathologically enlarged retroperitoneal lymph nodes. There is a 3.2 cm heterogeneous mass adjacent to the pancreatic body and left hepatic lobe. There is an additional 2.5 cm heterogeneous mass adjacent to the pancreatic head. --No mesenteric lymphadenopathy. --No pelvic or inguinal lymphadenopathy. Reproductive: Unremarkable Other: No ascites or free air. The abdominal wall is normal. Musculoskeletal. There is a unilateral pars defect at L5 on the left without evidence for significant anterolisthesis. The patient is status post total hip arthroplasty on the left. IMPRESSION: 1. Distended gallbladder with gallbladder wall thickening raising concern for acute cholecystitis. Follow-up with ultrasound is recommended. 2. Multiple masses are noted in the left breast tissue. Findings are concerning for underlying breast carcinoma. Correlation with outpatient mammographies recommended. 3. A 3.2 cm and a 2.5 cm mass are noted adjacent to the pancreatic body and head. Findings are  concerning for pathologically enlarged lymph nodes. Nodal metastatic disease is not excluded. Follow-up is recommended. Electronically Signed   By: Constance Holster M.D.   On: 06/21/2019 22:34   US Abdomen Limited Ruq  Result Date: 06/21/2019 CLINICAL DATA:  Right upper quadrant pain EXAM: ULTRASOUND ABDOMEN LIMITED RIGHT UPPER QUADRANT COMPARISON:  None. FINDINGS: Gallbladder: Large amount of sludge within the gallbladder. Gallbladder wall is borderline thickened at 3.6 mm. Negative sonographic Murphy's. No visible stones. Common bile duct: Diameter: Normal caliber, 4 mm Liver: No focal lesion identified. Within normal limits in parenchymal echogenicity. Portal vein is patent on color Doppler imaging with normal direction of blood flow towards the liver. Other: None. IMPRESSION: Large amount of sludge within the gallbladder with borderline gallbladder wall thickness. No visible stones or sonographic Murphy sign. Electronically Signed   By: Rolm Baptise M.D.   On: 06/21/2019 23:54      Scheduled Meds: . acetaminophen  650 mg Oral Once  . enoxaparin (LOVENOX) injection  40 mg Subcutaneous Q24H  . pantoprazole  40 mg Oral Daily  . polyethylene glycol  17 g Oral Daily   Continuous Infusions:   LOS: 1 day      Time spent: 25 minutes   Dessa Phi, DO [facility] [email or web address] 06/23/2019, 1:02 PM

## 2019-06-23 NOTE — Progress Notes (Signed)
Pt's daughter stating that pt says she feels like she is wearing a tight bra (pt not wearing any bra). Pain to upper abdomen bilaterally. Pt lying in bed, after just eating approximately a bowl of "something the daughter cooked and brought from home". Getting a EKG at this time.

## 2019-06-23 NOTE — Progress Notes (Signed)
  Progress Note: General Surgery Service   Assessment/Plan: Principal Problem:   Abdominal pain Active Problems:   Gallbladder sludge   Pancreatic mass   Mass of breast, left  Constipation -will give mag citrate challenge today  Gallbladder distension and sludge -persistent postprandial pain -if no improvement with laxative will proceed with gallbladder surgery tomorrow -NPO after midnight We discussed the etiology of her pain, we discussed treatment options and recommended surgery. We discussed details of surgery including general anesthesia, laparoscopic approach, identification of cystic duct and common bile duct. Ligation of cystic duct and cystic artery. Possible need for intraoperative cholangiogram or open procedure. Possible risks of common bile duct injury, liver injury, cystic duct leak, bleeding, infection, post-cholecystectomy syndrome. The patient showed good understanding and all questions were answered    LOS: 1 day  Chief Complaint/Subjective: (Discussion performed with daughter as interpreter) No vomiting, multiple episodes of chest/epigastric pain/tightness after eating. No bowel movement in 5 days  Objective: Vital signs in last 24 hours: Temp:  [98.1 F (36.7 C)-99.3 F (37.4 C)] 99.2 F (37.3 C) (08/02 0635) Pulse Rate:  [74-82] 76 (08/02 0635) Resp:  [18] 18 (08/02 0635) BP: (102-120)/(64-70) 120/68 (08/02 0635) SpO2:  [93 %-96 %] 95 % (08/02 0635) Weight:  [62.7 kg] 62.7 kg (08/02 0734) Last BM Date: 06/19/19  Intake/Output from previous day: 08/01 0701 - 08/02 0700 In: 120 [P.O.:120] Out: 2 [Urine:2] Intake/Output this shift: Total I/O In: 240 [P.O.:240] Out: 1 [Urine:1]  Lungs: nonlabored  Cardiovascular: RRR  Abd: soft, tender in epigastrium and left lower quadrant  Extremities: no edema  Neuro: AOx4  Lab Results: CBC  Recent Labs    06/21/19 1322 06/22/19 0622  WBC 8.4 7.7  HGB 12.6 11.5*  HCT 39.1 36.6  PLT 236 240   BMET  Recent Labs    06/21/19 1322 06/22/19 0622  NA 137 137  K 3.7 3.5  CL 103 104  CO2 26 25  GLUCOSE 157* 123*  BUN 14 12  CREATININE 0.71 0.75  CALCIUM 9.1 8.4*   PT/INR Recent Labs    06/22/19 0622  LABPROT 13.7  INR 1.1   ABG No results for input(s): PHART, HCO3 in the last 72 hours.  Invalid input(s): PCO2, PO2  Studies/Results:  Anti-infectives: Anti-infectives (From admission, onward)   Start     Dose/Rate Route Frequency Ordered Stop   06/22/19 0600  piperacillin-tazobactam (ZOSYN) IVPB 3.375 g  Status:  Discontinued     3.375 g 12.5 mL/hr over 240 Minutes Intravenous Every 8 hours 06/22/19 0527 06/22/19 1211   06/21/19 2315  piperacillin-tazobactam (ZOSYN) IVPB 3.375 g     3.375 g 100 mL/hr over 30 Minutes Intravenous  Once 06/21/19 2306 06/22/19 0014      Medications: Scheduled Meds: . acetaminophen  650 mg Oral Once  . enoxaparin (LOVENOX) injection  40 mg Subcutaneous Q24H  . pantoprazole  40 mg Oral Daily  . polyethylene glycol  17 g Oral Daily   Continuous Infusions: PRN Meds:.diphenhydrAMINE **OR** diphenhydrAMINE, diphenhydrAMINE, fentaNYL (SUBLIMAZE) injection, ondansetron (ZOFRAN) IV  Mickeal Skinner, MD [facility]

## 2019-06-24 ENCOUNTER — Inpatient Hospital Stay (HOSPITAL_COMMUNITY): Payer: Self-pay | Admitting: Certified Registered"

## 2019-06-24 ENCOUNTER — Encounter (HOSPITAL_COMMUNITY)
Admission: EM | Disposition: A | Discharge status: Home Health | Payer: Self-pay | Source: Home / Self Care | Attending: Internal Medicine

## 2019-06-24 DIAGNOSIS — K828 Other specified diseases of gallbladder: Secondary | ICD-10-CM

## 2019-06-24 DIAGNOSIS — R1011 Right upper quadrant pain: Secondary | ICD-10-CM

## 2019-06-24 HISTORY — PX: CHOLECYSTECTOMY: SHX55

## 2019-06-24 LAB — SURGICAL PCR SCREEN
MRSA, PCR: POSITIVE — AB
Staphylococcus aureus: POSITIVE — AB

## 2019-06-24 LAB — COMPREHENSIVE METABOLIC PANEL
ALT: 15 U/L (ref 0–44)
AST: 20 U/L (ref 15–41)
Albumin: 3.5 g/dL (ref 3.5–5.0)
Alkaline Phosphatase: 68 U/L (ref 38–126)
Anion gap: 11 (ref 5–15)
BUN: 14 mg/dL (ref 8–23)
CO2: 24 mmol/L (ref 22–32)
Calcium: 8.7 mg/dL — ABNORMAL LOW (ref 8.9–10.3)
Chloride: 104 mmol/L (ref 98–111)
Creatinine, Ser: 0.7 mg/dL (ref 0.44–1.00)
GFR calc Af Amer: >60 mL/min (ref >60)
GFR calc non Af Amer: >60 mL/min (ref >60)
Glucose, Bld: 114 mg/dL — ABNORMAL HIGH (ref 70–99)
Potassium: 3.4 mmol/L — ABNORMAL LOW (ref 3.5–5.1)
Sodium: 139 mmol/L (ref 135–145)
Total Bilirubin: 1.1 mg/dL (ref 0.3–1.2)
Total Protein: 7.4 g/dL (ref 6.5–8.1)

## 2019-06-24 LAB — CBC
HCT: 37.6 % (ref 36.0–46.0)
Hemoglobin: 12.1 g/dL (ref 12.0–15.0)
MCH: 30.4 pg (ref 26.0–34.0)
MCHC: 32.2 g/dL (ref 30.0–36.0)
MCV: 94.5 fL (ref 80.0–100.0)
Platelets: 253 10*3/uL (ref 150–400)
RBC: 3.98 MIL/uL (ref 3.87–5.11)
RDW: 12.9 % (ref 11.5–15.5)
WBC: 7.6 10*3/uL (ref 4.0–10.5)
nRBC: 0 % (ref 0.0–0.2)

## 2019-06-24 SURGERY — LAPAROSCOPIC CHOLECYSTECTOMY WITH INTRAOPERATIVE CHOLANGIOGRAM
Anesthesia: General

## 2019-06-24 MED ORDER — MAGIC MOUTHWASH
15.0000 mL | Freq: Four times a day (QID) | ORAL | Status: DC | PRN
  Filled 2019-06-24: qty 15

## 2019-06-24 MED ORDER — LIDOCAINE 2% (20 MG/ML) 5 ML SYRINGE
INTRAMUSCULAR | Status: AC
  Filled 2019-06-24: qty 5

## 2019-06-24 MED ORDER — POTASSIUM CHLORIDE IN NACL 20-0.9 MEQ/L-% IV SOLN: INTRAVENOUS | Status: DC

## 2019-06-24 MED ORDER — DEXAMETHASONE SODIUM PHOSPHATE 10 MG/ML IJ SOLN
INTRAMUSCULAR | Status: AC
  Filled 2019-06-24: qty 4

## 2019-06-24 MED ORDER — ROCURONIUM BROMIDE 10 MG/ML (PF) SYRINGE
PREFILLED_SYRINGE | INTRAVENOUS | Status: AC
  Filled 2019-06-24: qty 30

## 2019-06-24 MED ORDER — DEXAMETHASONE SODIUM PHOSPHATE 10 MG/ML IJ SOLN
INTRAMUSCULAR | Status: DC | PRN
  Administered 2019-06-24: 6 mg via INTRAVENOUS

## 2019-06-24 MED ORDER — SODIUM CHLORIDE 0.9 % IV SOLN: INTRAVENOUS | Status: DC

## 2019-06-24 MED ORDER — DEXAMETHASONE SODIUM PHOSPHATE 10 MG/ML IJ SOLN
INTRAMUSCULAR | Status: AC
  Filled 2019-06-24: qty 1

## 2019-06-24 MED ORDER — LIP MEDEX EX OINT
1.0000 "application " | TOPICAL_OINTMENT | Freq: Two times a day (BID) | CUTANEOUS | Status: DC
  Administered 2019-06-24 – 2019-07-01 (×12): 1 via TOPICAL
  Filled 2019-06-24 (×3): qty 7

## 2019-06-24 MED ORDER — SUCCINYLCHOLINE CHLORIDE 200 MG/10ML IV SOSY
PREFILLED_SYRINGE | INTRAVENOUS | Status: AC
  Filled 2019-06-24: qty 10

## 2019-06-24 MED ORDER — FENTANYL CITRATE (PF) 100 MCG/2ML IJ SOLN
INTRAMUSCULAR | Status: AC
  Filled 2019-06-24: qty 2

## 2019-06-24 MED ORDER — CHLORHEXIDINE GLUCONATE CLOTH 2 % EX PADS
6.0000 | MEDICATED_PAD | Freq: Every day | CUTANEOUS | Status: AC
  Administered 2019-06-26 – 2019-06-28 (×3): 6 via TOPICAL

## 2019-06-24 MED ORDER — SUGAMMADEX SODIUM 200 MG/2ML IV SOLN
INTRAVENOUS | Status: DC | PRN
  Administered 2019-06-24: 150 mg via INTRAVENOUS

## 2019-06-24 MED ORDER — ROCURONIUM BROMIDE 10 MG/ML (PF) SYRINGE
PREFILLED_SYRINGE | INTRAVENOUS | Status: DC | PRN
  Administered 2019-06-24: 20 mg via INTRAVENOUS
  Administered 2019-06-24 (×2): 5 mg via INTRAVENOUS

## 2019-06-24 MED ORDER — LIDOCAINE 2% (20 MG/ML) 5 ML SYRINGE
INTRAMUSCULAR | Status: DC | PRN
  Administered 2019-06-24: 1 mg/kg/h via INTRAVENOUS

## 2019-06-24 MED ORDER — ONDANSETRON HCL 4 MG/2ML IJ SOLN
INTRAMUSCULAR | Status: AC
  Filled 2019-06-24: qty 2

## 2019-06-24 MED ORDER — SUCCINYLCHOLINE CHLORIDE 200 MG/10ML IV SOSY
PREFILLED_SYRINGE | INTRAVENOUS | Status: DC | PRN
  Administered 2019-06-24: 100 mg via INTRAVENOUS

## 2019-06-24 MED ORDER — ACETAMINOPHEN 500 MG PO TABS
1000.0000 mg | ORAL_TABLET | Freq: Three times a day (TID) | ORAL | Status: DC
  Administered 2019-06-24 – 2019-06-27 (×7): 1000 mg via ORAL
  Filled 2019-06-24 (×7): qty 2

## 2019-06-24 MED ORDER — PROPOFOL 10 MG/ML IV BOLUS
INTRAVENOUS | Status: DC | PRN
  Administered 2019-06-24: 100 mg via INTRAVENOUS

## 2019-06-24 MED ORDER — ONDANSETRON HCL 4 MG/2ML IJ SOLN
INTRAMUSCULAR | Status: DC | PRN
  Administered 2019-06-24: 4 mg via INTRAVENOUS

## 2019-06-24 MED ORDER — LACTATED RINGERS IR SOLN
Status: DC | PRN
  Administered 2019-06-24: 3000 mL
  Administered 2019-06-24 (×3): 1000 mL

## 2019-06-24 MED ORDER — FENTANYL CITRATE (PF) 100 MCG/2ML IJ SOLN
INTRAMUSCULAR | Status: DC | PRN
  Administered 2019-06-24 (×7): 50 ug via INTRAVENOUS

## 2019-06-24 MED ORDER — GABAPENTIN 300 MG PO CAPS
300.0000 mg | ORAL_CAPSULE | Freq: Every day | ORAL | Status: AC
  Administered 2019-06-24 – 2019-06-26 (×3): 300 mg via ORAL
  Filled 2019-06-24 (×3): qty 1

## 2019-06-24 MED ORDER — CHLORHEXIDINE GLUCONATE CLOTH 2 % EX PADS: 6.0000 | MEDICATED_PAD | Freq: Once | CUTANEOUS | Status: DC

## 2019-06-24 MED ORDER — POTASSIUM CHLORIDE IN NACL 40-0.9 MEQ/L-% IV SOLN
Freq: Once | INTRAVENOUS | Status: AC
  Administered 2019-06-24: 75 mL/h via INTRAVENOUS
  Filled 2019-06-24: qty 1000

## 2019-06-24 MED ORDER — PROPOFOL 10 MG/ML IV BOLUS
INTRAVENOUS | Status: AC
  Filled 2019-06-24: qty 20

## 2019-06-24 MED ORDER — GABAPENTIN 300 MG PO CAPS
300.0000 mg | ORAL_CAPSULE | ORAL | Status: DC
  Filled 2019-06-24: qty 1

## 2019-06-24 MED ORDER — OXYCODONE HCL 5 MG/5ML PO SOLN: 5.0000 mg | Freq: Once | ORAL | Status: DC | PRN

## 2019-06-24 MED ORDER — GABAPENTIN 300 MG PO CAPS
300.0000 mg | ORAL_CAPSULE | ORAL | Status: AC
  Administered 2019-06-24: 300 mg via ORAL

## 2019-06-24 MED ORDER — LIDOCAINE 2% (20 MG/ML) 5 ML SYRINGE
INTRAMUSCULAR | Status: DC | PRN
  Administered 2019-06-24: 100 mg via INTRAVENOUS

## 2019-06-24 MED ORDER — BUPIVACAINE-EPINEPHRINE (PF) 0.25% -1:200000 IJ SOLN
INTRAMUSCULAR | Status: AC
  Filled 2019-06-24: qty 30

## 2019-06-24 MED ORDER — SODIUM CHLORIDE 0.9 % IR SOLN
Status: DC | PRN
  Administered 2019-06-24: 1000 mL

## 2019-06-24 MED ORDER — ADULT MULTIVITAMIN W/MINERALS CH
1.0000 | ORAL_TABLET | Freq: Every day | ORAL | Status: DC
  Administered 2019-06-25 – 2019-07-01 (×6): 1 via ORAL
  Filled 2019-06-24 (×7): qty 1

## 2019-06-24 MED ORDER — METRONIDAZOLE IN NACL 5-0.79 MG/ML-% IV SOLN
500.0000 mg | INTRAVENOUS | Status: DC
  Filled 2019-06-24: qty 100

## 2019-06-24 MED ORDER — PIPERACILLIN-TAZOBACTAM 3.375 G IVPB
3.3750 g | Freq: Three times a day (TID) | INTRAVENOUS | Status: AC
  Administered 2019-06-24 – 2019-06-26 (×5): 3.375 g via INTRAVENOUS
  Filled 2019-06-24 (×5): qty 50

## 2019-06-24 MED ORDER — SODIUM CHLORIDE 0.9 % IV SOLN
INTRAVENOUS | Status: DC
  Filled 2019-06-24 (×3): qty 1000

## 2019-06-24 MED ORDER — MUPIROCIN 2 % EX OINT
1.0000 "application " | TOPICAL_OINTMENT | Freq: Two times a day (BID) | CUTANEOUS | Status: AC
  Administered 2019-06-24 – 2019-06-28 (×10): 1 via NASAL
  Filled 2019-06-24 (×2): qty 22

## 2019-06-24 MED ORDER — METRONIDAZOLE IN NACL 5-0.79 MG/ML-% IV SOLN
500.0000 mg | INTRAVENOUS | Status: AC
  Administered 2019-06-24: 500 mg via INTRAVENOUS

## 2019-06-24 MED ORDER — FENTANYL CITRATE (PF) 100 MCG/2ML IJ SOLN
25.0000 ug | INTRAMUSCULAR | Status: DC | PRN
  Administered 2019-06-24: 50 ug via INTRAVENOUS

## 2019-06-24 MED ORDER — PHENYLEPHRINE 40 MCG/ML (10ML) SYRINGE FOR IV PUSH (FOR BLOOD PRESSURE SUPPORT)
PREFILLED_SYRINGE | INTRAVENOUS | Status: AC
  Filled 2019-06-24: qty 20

## 2019-06-24 MED ORDER — PROMETHAZINE HCL 25 MG/ML IJ SOLN: 6.2500 mg | INTRAMUSCULAR | Status: DC | PRN

## 2019-06-24 MED ORDER — LACTATED RINGERS IV SOLN
INTRAVENOUS | Status: DC
  Administered 2019-06-24 (×2): via INTRAVENOUS

## 2019-06-24 MED ORDER — CEFAZOLIN SODIUM-DEXTROSE 2-4 GM/100ML-% IV SOLN
2.0000 g | INTRAVENOUS | Status: AC
  Administered 2019-06-24: 15:00:00 2 g via INTRAVENOUS
  Filled 2019-06-24: qty 100

## 2019-06-24 MED ORDER — ACETAMINOPHEN 500 MG PO TABS
1000.0000 mg | ORAL_TABLET | Freq: Once | ORAL | Status: AC
  Administered 2019-06-24: 1000 mg via ORAL
  Filled 2019-06-24: qty 2

## 2019-06-24 MED ORDER — OXYCODONE HCL 5 MG PO TABS: 5.0000 mg | ORAL_TABLET | Freq: Once | ORAL | Status: DC | PRN

## 2019-06-24 MED ORDER — POTASSIUM CHLORIDE IN NACL 40-0.9 MEQ/L-% IV SOLN
Freq: Once | INTRAVENOUS | Status: AC
  Administered 2019-06-24: 50 mL/h via INTRAVENOUS
  Filled 2019-06-24: qty 1000

## 2019-06-24 MED ORDER — ALBUMIN HUMAN 5 % IV SOLN
12.5000 g | Freq: Four times a day (QID) | INTRAVENOUS | Status: AC | PRN
  Filled 2019-06-24: qty 250

## 2019-06-24 MED ORDER — BUPIVACAINE-EPINEPHRINE 0.25% -1:200000 IJ SOLN
INTRAMUSCULAR | Status: DC | PRN
  Administered 2019-06-24: 30 mL

## 2019-06-24 MED ORDER — CEFAZOLIN SODIUM-DEXTROSE 2-4 GM/100ML-% IV SOLN: 2.0000 g | INTRAVENOUS | Status: DC

## 2019-06-24 MED ORDER — BUPIVACAINE LIPOSOME 1.3 % IJ SUSP
20.0000 mL | INTRAMUSCULAR | Status: DC
  Filled 2019-06-24 (×2): qty 20

## 2019-06-24 MED ORDER — BUPIVACAINE LIPOSOME 1.3 % IJ SUSP
INTRAMUSCULAR | Status: DC | PRN
  Administered 2019-06-24: 20 mL

## 2019-06-24 MED ORDER — BUPIVACAINE LIPOSOME 1.3 % IJ SUSP: 20.0000 mL | INTRAMUSCULAR | Status: DC

## 2019-06-24 MED ORDER — CHLORHEXIDINE GLUCONATE CLOTH 2 % EX PADS
6.0000 | MEDICATED_PAD | Freq: Once | CUTANEOUS | Status: AC
  Administered 2019-06-24: 14:00:00 6 via TOPICAL

## 2019-06-24 MED ORDER — SODIUM CHLORIDE 0.9 % IV SOLN
INTRAVENOUS | Status: DC | PRN
  Administered 2019-06-24: 15 ug/min via INTRAVENOUS

## 2019-06-24 MED ORDER — ACETAMINOPHEN 500 MG PO TABS: 1000.0000 mg | ORAL_TABLET | ORAL | Status: DC

## 2019-06-24 SURGICAL SUPPLY — 45 items
APPLIER CLIP 5 13 M/L LIGAMAX5 (MISCELLANEOUS)
APPLIER CLIP ROT 10 11.4 M/L (STAPLE)
CABLE HIGH FREQUENCY MONO STRZ (ELECTRODE) IMPLANT
CLIP APPLIE 5 13 M/L LIGAMAX5 (MISCELLANEOUS) IMPLANT
CLIP APPLIE ROT 10 11.4 M/L (STAPLE) IMPLANT
COVER MAYO STAND STRL (DRAPES) ×1 IMPLANT
COVER SURGICAL LIGHT HANDLE (MISCELLANEOUS) ×2 IMPLANT
COVER WAND RF STERILE (DRAPES) ×2 IMPLANT
DECANTER SPIKE VIAL GLASS SM (MISCELLANEOUS) ×2 IMPLANT
DRAIN CHANNEL 19F RND (DRAIN) ×1 IMPLANT
DRAPE C-ARM 42X120 X-RAY (DRAPES) ×1 IMPLANT
DRAPE UTILITY XL STRL (DRAPES) ×2 IMPLANT
DRAPE WARM FLUID 44X44 (DRAPES) ×2 IMPLANT
DRSG TEGADERM 2-3/8X2-3/4 SM (GAUZE/BANDAGES/DRESSINGS) ×5 IMPLANT
DRSG TEGADERM 4X4.75 (GAUZE/BANDAGES/DRESSINGS) ×3 IMPLANT
ELECT REM PT RETURN 15FT ADLT (MISCELLANEOUS) ×2 IMPLANT
ENDOLOOP SUT PDS II  0 18 (SUTURE) ×2
ENDOLOOP SUT PDS II 0 18 (SUTURE) IMPLANT
EVACUATOR SILICONE 100CC (DRAIN) ×1 IMPLANT
GAUZE SPONGE 2X2 8PLY STRL LF (GAUZE/BANDAGES/DRESSINGS) IMPLANT
GLOVE ECLIPSE 8.0 STRL XLNG CF (GLOVE) ×2 IMPLANT
GLOVE INDICATOR 8.0 STRL GRN (GLOVE) ×2 IMPLANT
GOWN STRL REUS W/TWL XL LVL3 (GOWN DISPOSABLE) ×4 IMPLANT
IRRIG SUCT STRYKERFLOW 2 WTIP (MISCELLANEOUS) ×2
IRRIGATION SUCT STRKRFLW 2 WTP (MISCELLANEOUS) ×1 IMPLANT
KIT BASIN OR (CUSTOM PROCEDURE TRAY) ×2 IMPLANT
KIT TURNOVER KIT A (KITS) IMPLANT
POUCH RETRIEVAL ECOSAC 10 (ENDOMECHANICALS) ×1 IMPLANT
POUCH RETRIEVAL ECOSAC 10MM (ENDOMECHANICALS) ×2
SCISSORS LAP 5X35 DISP (ENDOMECHANICALS) ×2 IMPLANT
SET CHOLANGIOGRAPH MIX (MISCELLANEOUS) ×1 IMPLANT
SET TUBE SMOKE EVAC HIGH FLOW (TUBING) ×2 IMPLANT
SHEARS HARMONIC ACE PLUS 36CM (ENDOMECHANICALS) ×1 IMPLANT
SLEEVE XCEL OPT CAN 5 100 (ENDOMECHANICALS) ×2 IMPLANT
SPONGE GAUZE 2X2 STER 10/PKG (GAUZE/BANDAGES/DRESSINGS) ×1
SUT MNCRL AB 4-0 PS2 18 (SUTURE) ×2 IMPLANT
SUT PDS AB 1 CT1 27 (SUTURE) ×3 IMPLANT
SUT PROLENE 2 0 CT2 30 (SUTURE) ×1 IMPLANT
SUT PROLENE 2 0 KS (SUTURE) ×1 IMPLANT
SYR 20ML LL LF (SYRINGE) ×2 IMPLANT
TOWEL OR 17X26 10 PK STRL BLUE (TOWEL DISPOSABLE) ×2 IMPLANT
TOWEL OR NON WOVEN STRL DISP B (DISPOSABLE) ×2 IMPLANT
TRAY LAPAROSCOPIC (CUSTOM PROCEDURE TRAY) ×2 IMPLANT
TROCAR BLADELESS OPT 5 100 (ENDOMECHANICALS) ×2 IMPLANT
TROCAR XCEL NON-BLD 11X100MML (ENDOMECHANICALS) ×2 IMPLANT

## 2019-06-24 NOTE — Anesthesia Procedure Notes (Signed)
Procedure Name: Intubation Date/Time: 06/24/2019 3:06 PM Performed by: Cynda Familia, CRNA Pre-anesthesia Checklist: Patient identified, Emergency Drugs available, Suction available and Patient being monitored Patient Re-evaluated:Patient Re-evaluated prior to induction Oxygen Delivery Method: Circle System Utilized Preoxygenation: Pre-oxygenation with 100% oxygen Induction Type: IV induction, Rapid sequence and Cricoid Pressure applied Ventilation: Mask ventilation without difficulty Laryngoscope Size: Miller and 2 Grade View: Grade I Tube type: Oral Number of attempts: 1 Airway Equipment and Method: Stylet Placement Confirmation: ETT inserted through vocal cords under direct vision,  positive ETCO2 and breath sounds checked- equal and bilateral Secured at: 21 cm Tube secured with: Tape Dental Injury: Teeth and Oropharynx as per pre-operative assessment  Comments: Smooth IV induction Rose-- intubation AM CRNA atraumatic-- mouth as preop-- bilat BS

## 2019-06-24 NOTE — Progress Notes (Signed)
[place]   DOB:[birth year]   WU#:[id number]   GNF#:[id number]  Oncology follow up   Subjective: Due to significant postprandial abdominal pain, patient was not able to be discharged.  She took multiple laxative, and finally had BM with diarrhea. Her abdominal pain is much better today, she is NPO for possible EGD and surgery. She is in good spirit. Daughter at bed side.     Objective:  Vitals:   06/23/19 2056 06/24/19 0500  BP: 115/66 116/76  Pulse: 81 70  Resp: 17 17  Temp: 99.3 F (37.4 C) 99.4 F (37.4 C)  SpO2: 96% 93%    Body mass index is 27.21 kg/m.  Intake/Output Summary (Last 24 hours) at 06/24/2019 1727 Last data filed at 06/24/2019 1648 Gross per 24 hour  Intake 1240 ml  Output 302 ml  Net 938 ml     Sclerae unicteric  Oropharynx clear  No peripheral adenopathy  Lungs clear -- no rales or rhonchi  Heart regular rate and rhythm  Abdomen soft, (+) tenderness at RUQ, (+) murphy sign   MSK no focal spinal tenderness, no peripheral edema  Neuro nonfocal   CBG (last 3)  No results for input(s): GLUCAP in the last 72 hours.   Labs:  Lab Results  Component Value Date   WBC 7.6 06/24/2019   HGB 12.1 06/24/2019   HCT 37.6 06/24/2019   MCV 94.5 06/24/2019   PLT 253 06/24/2019    Urine Studies No results for input(s): UHGB, CRYS in the last 72 hours.  Invalid input(s): UACOL, UAPR, USPG, UPH, UTP, UGL, UKET, UBIL, UNIT, UROB, ULEU, UEPI, UWBC, URBC, UBAC, CAST, [place], [place]  Basic Metabolic Panel: Recent Labs  Lab 06/21/19 1322 06/22/19 0622 06/24/19 0537  NA 137 137 139  K 3.7 3.5 3.4*  CL 103 104 104  CO2 26 25 24   GLUCOSE 157* 123* 114*  BUN 14 12 14   CREATININE 0.71 0.75 0.70  CALCIUM 9.1 8.4* 8.7*   GFR CrCl cannot be calculated (Unknown ideal weight.). Liver Function Tests: Recent Labs  Lab 06/21/19 1322 06/22/19 0622 06/24/19 0537  AST 25 19 20   ALT 17 15 15   ALKPHOS 66 61 68  BILITOT 0.7 1.1 1.1  PROT 8.2* 6.9 7.4  ALBUMIN 4.0 3.5  3.5   Recent Labs  Lab 06/21/19 1322  LIPASE 34   No results for input(s): AMMONIA in the last 168 hours. Coagulation profile Recent Labs  Lab 06/22/19 0622  INR 1.1    CBC: Recent Labs  Lab 06/21/19 1322 06/22/19 0622 06/24/19 0537  WBC 8.4 7.7 7.6  HGB 12.6 11.5* 12.1  HCT 39.1 36.6 37.6  MCV 93.5 93.4 94.5  PLT 236 240 253   Cardiac Enzymes: No results for input(s): CKTOTAL, CKMB, CKMBINDEX, TROPONINI in the last 168 hours. BNP: Invalid input(s): POCBNP CBG: No results for input(s): GLUCAP in the last 168 hours. D-Dimer No results for input(s): DDIMER in the last 72 hours. Hgb A1c Recent Labs    06/22/19 0622  HGBA1C 5.9*   Lipid Profile No results for input(s): CHOL, HDL, LDLCALC, TRIG, CHOLHDL, LDLDIRECT in the last 72 hours. Thyroid function studies No results for input(s): TSH, T4TOTAL, T3FREE, THYROIDAB in the last 72 hours.  Invalid input(s): FREET3 Anemia work up No results for input(s): VITAMINB12, FOLATE, FERRITIN, TIBC, IRON, RETICCTPCT in the last 72 hours. Microbiology Recent Results (from the past 240 hour(s))  SARS Coronavirus 2 [facility] order, Performed in [facility] hospital lab) Nasopharyngeal Nasopharyngeal Swab  Status: None   Collection Time: 06/21/19 10:56 PM   Specimen: Nasopharyngeal Swab  Result Value Ref Range Status   SARS Coronavirus 2 NEGATIVE NEGATIVE Final    Comment: (NOTE) If result is NEGATIVE SARS-CoV-2 target nucleic acids are NOT DETECTED. The SARS-CoV-2 RNA is generally detectable in upper and lower  respiratory specimens during the acute phase of infection. The lowest  concentration of SARS-CoV-2 viral copies this assay can detect is 250  copies / mL. A negative result does not preclude SARS-CoV-2 infection  and should not be used as the sole basis for treatment or other  patient management decisions.  A negative result may occur with  improper specimen collection / handling, submission of specimen other   than nasopharyngeal swab, presence of viral mutation(s) within the  areas targeted by this assay, and inadequate number of viral copies  (<250 copies / mL). A negative result must be combined with clinical  observations, patient history, and epidemiological information. If result is POSITIVE SARS-CoV-2 target nucleic acids are DETECTED. The SARS-CoV-2 RNA is generally detectable in upper and lower  respiratory specimens dur ing the acute phase of infection.  Positive  results are indicative of active infection with SARS-CoV-2.  Clinical  correlation with patient history and other diagnostic information is  necessary to determine patient infection status.  Positive results do  not rule out bacterial infection or co-infection with other viruses. If result is PRESUMPTIVE POSTIVE SARS-CoV-2 nucleic acids MAY BE PRESENT.   A presumptive positive result was obtained on the submitted specimen  and confirmed on repeat testing.  While 2019 novel coronavirus  (SARS-CoV-2) nucleic acids may be present in the submitted sample  additional confirmatory testing may be necessary for epidemiological  and / or clinical management purposes  to differentiate between  SARS-CoV-2 and other Sarbecovirus currently known to infect humans.  If clinically indicated additional testing with an alternate test  methodology [phone]) is advised. The SARS-CoV-2 RNA is generally  detectable in upper and lower respiratory sp ecimens during the acute  phase of infection. The expected result is Negative. Fact Sheet for Patients:  StrictlyIdeas.no Fact Sheet for Healthcare Providers: BankingDealers.co.za This test is not yet approved or cleared by the [place] FDA and has been authorized for detection and/or diagnosis of SARS-CoV-2 by FDA under an Emergency Use Authorization (EUA).  This EUA will remain in effect (meaning this test can be used) for the duration of  the COVID-19 declaration under Section 564(b)(1) of the Act, 21 U.S.C. section 360bbb-3(b)(1), unless the authorization is terminated or revoked sooner. Performed at [facility], [place] [address], [place], [place] [address]   Surgical pcr screen     Status: Abnormal   Collection Time: 06/24/19  4:00 AM   Specimen: Nasal Mucosa; Nasal Swab  Result Value Ref Range Status   MRSA, PCR POSITIVE (A) NEGATIVE Final    Comment: RESULT CALLED TO, READ BACK BY AND VERIFIED WITH: BETHEL AT 0913 ON 06/24/2019 BY JPM    Staphylococcus aureus POSITIVE (A) NEGATIVE Final    Comment: (NOTE) The Xpert SA Assay (FDA approved for NASAL specimens in patients 40 years of age and older), is one component of a comprehensive surveillance program. It is not intended to diagnose infection nor to guide or monitor treatment. Performed at [facility], [place] [address] [place], [place] [address]       Studies:  No results found.  Assessment: 85 y.o. [place] woman, without significant past medical history,  presented with worsening epigastric pain for 5 days, along slightly decreased appetite, fatigue, and 7 pound weight loss in the past few months and left breast masses   1. Left breast masses (2), likely breast cancer 2.  Enlarged lymph nodes adjacent to the pancreatic body and head, concerning node metastasis, but atypical for metastatic breast cancer  3.  Distended gallbladder, probable subacute cholecystitis 4. Epigastric pain, secondary to #3    Plan:  -She now has more focused tenderness on the right upper quadrant, with positive Murphy sign, after constipation resolved.  This is concerning for cholecystitis. I spoke with Dr. Johney Maine today and he plan to take her to OR for cholecystectomy.  He will try to biopsy adjacent lymph node if feasible.  -I spoke with pt and her daughter, they agree with the plan.  -I will f/u    Truitt Merle, MD 06/24/2019  5:27 PM

## 2019-06-24 NOTE — Progress Notes (Signed)
Initial Nutrition Assessment  INTERVENTION:   -Diet advancement per MD -Recommend Ensure Enlive po BID, each supplement provides 350 kcal and 20 grams of protein once diet advanced.  NUTRITION DIAGNOSIS:   Inadequate oral intake related to (abdominal pain, cholecystitis) as evidenced by NPO status.  GOAL:   Patient will meet greater than or equal to 90% of their needs  MONITOR:   Diet advancement, Labs, Weight trends, I & O's  REASON FOR ASSESSMENT:   Consult Assessment of nutrition requirement/status  ASSESSMENT:   85 year old Mandarin speaking female with no significant past medical history presents with epigastric pain over the past 5 days. Admitted for RUQ abdominal pain, chronic cholecystitis  **RD working remotely**  Patient currently NPO for pending cholecystectomy. Pt was having abdominal pain for 5 days PTA. Pt has not eaten well during this time. Pt was on full liquids initially after admission. Pt consumed some pudding and something else from her family and had worse abdominal pain. Patient will likely benefit from nutritional supplements following surgery and diet advancement.   Per weight records, no weight changes. Pt did not indicate weight changes PTA. Per I/Os: +1.4L since admit.  Medications reviewed. Labs reviewed: Low K   NUTRITION - FOCUSED PHYSICAL EXAM:  Unable to perform -working remotely.  Diet Order:   Diet Order            Diet NPO time specified  Diet effective midnight        Diet general              EDUCATION NEEDS:   No education needs have been identified at this time  Skin:  Skin Assessment: Reviewed RN Assessment  Last BM:  8/2  Height:   Ht Readings from Last 1 Encounters:  10/10/17 5' (1.524 m)    Weight:   Wt Readings from Last 1 Encounters:  06/24/19 63.2 kg    Ideal Body Weight:  (Need height to calculate)  BMI:  Body mass index is 27.21 kg/m.  Estimated Nutritional Needs:   Kcal:   1600-1800  Protein:  75-85g  Fluid:  1.8L/day  Clayton Bibles, MS, RD, LDN [place] Dietitian Pager: [phone] After Hours Pager: [phone]

## 2019-06-24 NOTE — Progress Notes (Signed)
I discussed case with Dr Paulita Fujita who does EUS and has reviewed. He does not feel that EUS is indicated.

## 2019-06-24 NOTE — H&P (View-Only) (Signed)
Patient with more focal RUQ pain yet constipation & other issues improved.  GI underwhelmed Will offer cholecystectomy

## 2019-06-24 NOTE — Transfer of Care (Signed)
Immediate Anesthesia Transfer of Care Note  Patient: Rachel Hughes  Procedure(s) Performed: LAPAROSCOPIC CHOLECYSTECTOMY WITH INTRAOPERATIVE CHOLANGIOGRAM WITH LYMPH NODE BIOPSY (N/A )  Patient Location: PACU  Anesthesia Type:General  Level of Consciousness: sedated  Airway & Oxygen Therapy: Patient Spontanous Breathing and Patient connected to face mask oxygen  Post-op Assessment: Report given to RN and Post -op Vital signs reviewed and stable  Post vital signs: Reviewed and stable  Last Vitals:  Vitals Value Taken Time  BP 138/68 06/24/19 1737  Temp    Pulse 82 06/24/19 1738  Resp 13 06/24/19 1738  SpO2 100 % 06/24/19 1738  Vitals shown include unvalidated device data.  Last Pain:  Vitals:   06/24/19 0500  TempSrc: Oral  PainSc:          Complications: No apparent anesthesia complications

## 2019-06-24 NOTE — Anesthesia Procedure Notes (Signed)
Date/Time: 06/24/2019 5:30 PM Performed by: Cynda Familia, CRNA Oxygen Delivery Method: Simple face mask Placement Confirmation: breath sounds checked- equal and bilateral and positive ETCO2 Dental Injury: Teeth and Oropharynx as per pre-operative assessment

## 2019-06-24 NOTE — Progress Notes (Signed)
Patient with more focal RUQ pain yet constipation & other issues improved.  GI underwhelmed Will offer cholecystectomy

## 2019-06-24 NOTE — Progress Notes (Signed)
[facility] Progress Note      Assessment/Plan  Pancreatic lymphadenopathy Left breast mass - oncology following  Gallbladder distension and sludge, RUQ pain - likely chronic cholecystitis - will await GI recs regarding pancreatic lymphadenopathy? Prior to surgical planning - discussed this with daughter   FEN: NPO, IVF + K VTE: SCD's, lovenox ID: [email or web address] 07/31>> Foley: none Follow up: TBD  DISPO: pt symptoms appear consistent with chronic cholecystitis and likely needs lap chole this admission. She is still tender on exam. GI to reevaluate today and possible due EUS to further evaluate masses/lymph nodes near head of pancreatitis? This would likely not change our surgical course but we could try lymph node biopsy during lap chole. We will await further GI recs prior to proceeding with lap chole. Continue NPO, IVF.     LOS: 3 days    Subjective: CC: watery diarrhea after mag citrate  Daughter at bedside assisted with interpretation. Daughter states pt is having abdominal pain with ambulating. Pain is located on both sides of abdomen. She has had copious watery diarrhea since mag citrate. No formed stools.   Objective: Vital signs in last 24 hours: Temp:  [99 F (37.2 C)-99.4 F (37.4 C)] 99.4 F (37.4 C) (08/03 0500) Pulse Rate:  [70-81] 70 (08/03 0500) Resp:  [17-18] 17 (08/03 0500) BP: (113-116)/(65-76) 116/76 (08/03 0500) SpO2:  [93 %-98 %] 93 % (08/03 0500) Weight:  [63.1 kg-63.2 kg] 63.2 kg (08/03 0948) Last BM Date: 06/23/19  Intake/Output from previous day: 08/02 0701 - 08/03 0700 In: 820 [P.O.:820] Out: 10 [Urine:6; Stool:4] Intake/Output this shift: No intake/output data recorded.  PE: Gen:  Alert, NAD, pleasant, cooperative Pulm:  Rate and effort normal Abd: Soft, ND, TTP RUQ with guarding. No peritonitis  Skin: warm and dry   Anti-infectives: Anti-infectives (From admission, onward)   Start     Dose/Rate Route Frequency Ordered  Stop   06/22/19 0600  piperacillin-tazobactam (ZOSYN) IVPB 3.375 g  Status:  Discontinued     3.375 g 12.5 mL/hr over 240 Minutes Intravenous Every 8 hours 06/22/19 0527 06/22/19 1211   06/21/19 2315  piperacillin-tazobactam (ZOSYN) IVPB 3.375 g     3.375 g 100 mL/hr over 30 Minutes Intravenous  Once 06/21/19 2306 06/22/19 0014      Lab Results:  Recent Labs    06/22/19 0622 06/24/19 0537  WBC 7.7 7.6  HGB 11.5* 12.1  HCT 36.6 37.6  PLT 240 253   BMET Recent Labs    06/22/19 0622 06/24/19 0537  NA 137 139  K 3.5 3.4*  CL 104 104  CO2 25 24  GLUCOSE 123* 114*  BUN 12 14  CREATININE 0.75 0.70  CALCIUM 8.4* 8.7*   PT/INR Recent Labs    06/22/19 0622  LABPROT 13.7  INR 1.1   CMP     Component Value Date/Time   NA 139 06/24/2019 0537   K 3.4 (L) 06/24/2019 0537   CL 104 06/24/2019 0537   CO2 24 06/24/2019 0537   GLUCOSE 114 (H) 06/24/2019 0537   BUN 14 06/24/2019 0537   CREATININE 0.70 06/24/2019 0537   CALCIUM 8.7 (L) 06/24/2019 0537   PROT 7.4 06/24/2019 0537   ALBUMIN 3.5 06/24/2019 0537   AST 20 06/24/2019 0537   ALT 15 06/24/2019 0537   ALKPHOS 68 06/24/2019 0537   BILITOT 1.1 06/24/2019 0537   GFRNONAA >60 06/24/2019 0537   GFRAA >60 06/24/2019 0537   Lipase     Component Value Date/Time  LIPASE 34 06/21/2019 1322    Studies/Results: Ct Chest Wo Contrast  Result Date: 06/22/2019 CLINICAL DATA:  Metastatic breast cancer. EXAM: CT CHEST WITHOUT CONTRAST TECHNIQUE: Multidetector CT imaging of the chest was performed following the standard protocol without IV contrast. COMPARISON:  CT abdomen/pelvis from yesterday. FINDINGS: Cardiovascular: The heart is within normal limits in size. Small amount of pericardial fluid without overt effusion. There is mild tortuosity, ectasia and calcification of the thoracic aorta. Remarkably little coronary artery calcifications for age. Mediastinum/Nodes: Small scattered mediastinal and hilar lymph nodes but no mass  or overt adenopathy. Calcified left hilar lymph nodes are noted. The esophagus is grossly normal. Lungs/Pleura: No obvious pulmonary nodules to suggest pulmonary metastatic disease. No acute pulmonary findings. No pleural effusion. Upper Abdomen: Distended gallbladder again demonstrated along with periportal and celiac axis lymphadenopathy. Musculoskeletal: 2 adjacent rounded masses are noted in the left breast. Small scattered left axillary lymph nodes but no overt adenopathy. No subpectoral or supraclavicular adenopathy. The thyroid gland is grossly normal. No lytic or sclerotic bone lesions to suggest osseous metastatic disease. IMPRESSION: 1. Two adjacent rounded soft tissue masses in the left breast suspicious for breast cancer. No definite left axillary or supraclavicular adenopathy. 2. No CT findings for metastatic disease involving the chest/lungs. 3. Stable gallbladder enlargement and periportal and celiac axis lymphadenopathy. 4. No lytic or sclerotic bone lesions to suggest osseous metastatic disease. Aortic Atherosclerosis (ICD10-I70.0). Electronically Signed   By: Marijo Sanes M.D.   On: 06/22/2019 15:01      Kalman Drape , [place] [facility] Surgery 06/24/2019, 10:06 AM  Pager: [phone] Mon-Wed, Friday 7:00am-4:30pm Thurs 7am-11:30am  Consults: [phone]

## 2019-06-24 NOTE — Progress Notes (Signed)
PROGRESS NOTE    Rachel Hughes  JOA:[id number] DOB: Jan 25, 1933 DOA: 06/21/2019 PCP: Vivi Barrack, MD     Brief Narrative:  Rachel Hughes is a 85 year old Mandarin speaking female with no significant past medical history presents with epigastric pain over the past 5 days.  She has had no previous significant medical history, has not seen a physician since 2018.  She has never had a mammogram to her knowledge, if she had it was over 20 years ago likely. She states that she has had some intermittent abdominal pain in the past, always resolved at home but this time it has persisted which prompted her to seek care in the hospital.  Had no other complaints on admission.  In the emergency department, CT abdomen pelvis revealed distended gallbladder with gallbladder wall thickening raising concern for acute cholecystitis, multiple masses in the left breast tissue, mass adjacent to pancreatic body and head.  General surgery was consulted as well as GI, oncology.   New events last 24 hours / Subjective: Daughter is at bedside, able to translate.  States that she had multiple episodes of bowel movement yesterday.  She continues to have some postprandial abdominal pain, although is less in severity than previously.  No reports of nausea or vomiting.  Assessment & Plan:   Principal Problem:   Abdominal pain Active Problems:   Gallbladder sludge   Pancreatic mass   Mass of breast, left    Right upper quadrant abdominal pain, chronic cholecystitis -Patient does have tenderness to palpation right upper quadrant -RUQ [place]: Large amount of sludge within gallbladder with borderline gallbladder wall thickness, no visible stones -General surgery following for possible cholecystectomy inpatient  New breast mass, ?Pancreatic abnormality -CT abdomen pelvis revealed distended gallbladder with gallbladder wall thickening.  Multiple masses in the left breast tissue.  3.2 cm and 2.5 cm masses adjacent to the pancreatic  body and head -CT chest revealed 2 adjacent rounded soft tissue masses in the left breast suspicious for breast cancer, no CT findings of metastatic disease involving chest/lung, no lytic or sclerotic bone lesion to suggest osseous metastatic disease -GI and IR reviewed patient's imaging, patient's pancreas appeared normal.  IR recommended outpatient mammogram, ultrasound guided biopsy of left breast mass.  Pending work-up, may pursue MRI abdomen with and without contrast with MRCP per GI.  Oncology also evaluated patient, will have close follow-up as an outpatient with biopsy to be scheduled as an outpatient. -Dr. Paulita Fujita, GI, reviewed case and does not feel EUS indicated  Hypokalemia -Replace, trend   DVT prophylaxis: Lovenox  Code Status: Full Family Communication: Daughter at bedside Disposition Plan: Pending clinical improvement.  I did also leave a voicemail for the financial advisor to touch base with family as patient has no insurance and has some concerns and questions regarding hospital stay.   Consultants:   General surgery  GI  Oncology  Procedures:   None  Antimicrobials:  Anti-infectives (From admission, onward)   Start     Dose/Rate Route Frequency Ordered Stop   06/22/19 0600  piperacillin-tazobactam (ZOSYN) IVPB 3.375 g  Status:  Discontinued     3.375 g 12.5 mL/hr over 240 Minutes Intravenous Every 8 hours 06/22/19 0527 06/22/19 1211   06/21/19 2315  piperacillin-tazobactam (ZOSYN) IVPB 3.375 g     3.375 g 100 mL/hr over 30 Minutes Intravenous  Once 06/21/19 2306 06/22/19 0014       Objective: Vitals:   06/23/19 1324 06/23/19 2056 06/24/19 0500 06/24/19 6010  BP: 113/65 115/66 116/76   Pulse: 76 81 70   Resp: 18 17 17    Temp: 99 F (37.2 C) 99.3 F (37.4 C) 99.4 F (37.4 C)   TempSrc: Oral  Oral   SpO2: 98% 96% 93%   Weight:   63.1 kg 63.2 kg    Intake/Output Summary (Last 24 hours) at 06/24/2019 1308 Last data filed at 06/24/2019 0950 Gross per  24 hour  Intake 340 ml  Output 8 ml  Net 332 ml   Filed Weights   06/23/19 0734 06/24/19 0500 06/24/19 0948  Weight: 62.7 kg 63.1 kg 63.2 kg    Examination: General exam: Appears calm and comfortable  Respiratory system: Clear to auscultation. Respiratory effort normal. Cardiovascular system: S1 & S2 heard, RRR. No JVD, murmurs, rubs, gallops or clicks. No pedal edema. Gastrointestinal system: Abdomen is nondistended, soft and tender to palpation right upper quadrant Central nervous system: Alert. No focal neurological deficits. Extremities: Symmetric 5 x 5 power. Skin: No rashes, lesions or ulcers Psychiatry: Judgement and insight appear stable   Data Reviewed: I have personally reviewed following labs and imaging studies  CBC: Recent Labs  Lab 06/21/19 1322 06/22/19 0622 06/24/19 0537  WBC 8.4 7.7 7.6  HGB 12.6 11.5* 12.1  HCT 39.1 36.6 37.6  MCV 93.5 93.4 94.5  PLT 236 240 350   Basic Metabolic Panel: Recent Labs  Lab 06/21/19 1322 06/22/19 0622 06/24/19 0537  NA 137 137 139  K 3.7 3.5 3.4*  CL 103 104 104  CO2 26 25 24   GLUCOSE 157* 123* 114*  BUN 14 12 14   CREATININE 0.71 0.75 0.70  CALCIUM 9.1 8.4* 8.7*   GFR: CrCl cannot be calculated (Unknown ideal weight.). Liver Function Tests: Recent Labs  Lab 06/21/19 1322 06/22/19 0622 06/24/19 0537  AST 25 19 20   ALT 17 15 15   ALKPHOS 66 61 68  BILITOT 0.7 1.1 1.1  PROT 8.2* 6.9 7.4  ALBUMIN 4.0 3.5 3.5   Recent Labs  Lab 06/21/19 1322  LIPASE 34   No results for input(s): AMMONIA in the last 168 hours. Coagulation Profile: Recent Labs  Lab 06/22/19 0622  INR 1.1   Cardiac Enzymes: No results for input(s): CKTOTAL, CKMB, CKMBINDEX, TROPONINI in the last 168 hours. BNP (last 3 results) No results for input(s): PROBNP in the last 8760 hours. HbA1C: Recent Labs    06/22/19 0622  HGBA1C 5.9*   CBG: No results for input(s): GLUCAP in the last 168 hours. Lipid Profile: No results for  input(s): CHOL, HDL, LDLCALC, TRIG, CHOLHDL, LDLDIRECT in the last 72 hours. Thyroid Function Tests: No results for input(s): TSH, T4TOTAL, FREET4, T3FREE, THYROIDAB in the last 72 hours. Anemia Panel: No results for input(s): VITAMINB12, FOLATE, FERRITIN, TIBC, IRON, RETICCTPCT in the last 72 hours. Sepsis Labs: No results for input(s): PROCALCITON, LATICACIDVEN in the last 168 hours.  Recent Results (from the past 240 hour(s))  SARS Coronavirus 2 [facility] order, Performed in [facility] hospital lab) Nasopharyngeal Nasopharyngeal Swab     Status: None   Collection Time: 06/21/19 10:56 PM   Specimen: Nasopharyngeal Swab  Result Value Ref Range Status   SARS Coronavirus 2 NEGATIVE NEGATIVE Final    Comment: (NOTE) If result is NEGATIVE SARS-CoV-2 target nucleic acids are NOT DETECTED. The SARS-CoV-2 RNA is generally detectable in upper and lower  respiratory specimens during the acute phase of infection. The lowest  concentration of SARS-CoV-2 viral copies this assay can detect is 250  copies /  mL. A negative result does not preclude SARS-CoV-2 infection  and should not be used as the sole basis for treatment or other  patient management decisions.  A negative result may occur with  improper specimen collection / handling, submission of specimen other  than nasopharyngeal swab, presence of viral mutation(s) within the  areas targeted by this assay, and inadequate number of viral copies  (<250 copies / mL). A negative result must be combined with clinical  observations, patient history, and epidemiological information. If result is POSITIVE SARS-CoV-2 target nucleic acids are DETECTED. The SARS-CoV-2 RNA is generally detectable in upper and lower  respiratory specimens dur ing the acute phase of infection.  Positive  results are indicative of active infection with SARS-CoV-2.  Clinical  correlation with patient history and other diagnostic information is  necessary to determine  patient infection status.  Positive results do  not rule out bacterial infection or co-infection with other viruses. If result is PRESUMPTIVE POSTIVE SARS-CoV-2 nucleic acids MAY BE PRESENT.   A presumptive positive result was obtained on the submitted specimen  and confirmed on repeat testing.  While 2019 novel coronavirus  (SARS-CoV-2) nucleic acids may be present in the submitted sample  additional confirmatory testing may be necessary for epidemiological  and / or clinical management purposes  to differentiate between  SARS-CoV-2 and other Sarbecovirus currently known to infect humans.  If clinically indicated additional testing with an alternate test  methodology [phone]) is advised. The SARS-CoV-2 RNA is generally  detectable in upper and lower respiratory sp ecimens during the acute  phase of infection. The expected result is Negative. Fact Sheet for Patients:  StrictlyIdeas.no Fact Sheet for Healthcare Providers: BankingDealers.co.za This test is not yet approved or cleared by the [place] FDA and has been authorized for detection and/or diagnosis of SARS-CoV-2 by FDA under an Emergency Use Authorization (EUA).  This EUA will remain in effect (meaning this test can be used) for the duration of the COVID-19 declaration under Section 564(b)(1) of the Act, 21 U.S.C. section 360bbb-3(b)(1), unless the authorization is terminated or revoked sooner. Performed at [facility], [place] [address] [place], [place] [address]   Surgical pcr screen     Status: Abnormal   Collection Time: 06/24/19  4:00 AM   Specimen: Nasal Mucosa; Nasal Swab  Result Value Ref Range Status   MRSA, PCR POSITIVE (A) NEGATIVE Final    Comment: RESULT CALLED TO, READ BACK BY AND VERIFIED WITH: BETHEL AT 0913 ON 06/24/2019 BY JPM    Staphylococcus aureus POSITIVE (A) NEGATIVE Final    Comment: (NOTE) The Xpert SA Assay (FDA approved for  NASAL specimens in patients 31 years of age and older), is one component of a comprehensive surveillance program. It is not intended to diagnose infection nor to guide or monitor treatment. Performed at [facility], [place] [address] [place], [place] [address]       Radiology Studies: Ct Chest Wo Contrast  Result Date: 06/22/2019 CLINICAL DATA:  Metastatic breast cancer. EXAM: CT CHEST WITHOUT CONTRAST TECHNIQUE: Multidetector CT imaging of the chest was performed following the standard protocol without IV contrast. COMPARISON:  CT abdomen/pelvis from yesterday. FINDINGS: Cardiovascular: The heart is within normal limits in size. Small amount of pericardial fluid without overt effusion. There is mild tortuosity, ectasia and calcification of the thoracic aorta. Remarkably little coronary artery calcifications for age. Mediastinum/Nodes: Small scattered mediastinal and hilar lymph nodes but no mass or overt adenopathy. Calcified left hilar  lymph nodes are noted. The esophagus is grossly normal. Lungs/Pleura: No obvious pulmonary nodules to suggest pulmonary metastatic disease. No acute pulmonary findings. No pleural effusion. Upper Abdomen: Distended gallbladder again demonstrated along with periportal and celiac axis lymphadenopathy. Musculoskeletal: 2 adjacent rounded masses are noted in the left breast. Small scattered left axillary lymph nodes but no overt adenopathy. No subpectoral or supraclavicular adenopathy. The thyroid gland is grossly normal. No lytic or sclerotic bone lesions to suggest osseous metastatic disease. IMPRESSION: 1. Two adjacent rounded soft tissue masses in the left breast suspicious for breast cancer. No definite left axillary or supraclavicular adenopathy. 2. No CT findings for metastatic disease involving the chest/lungs. 3. Stable gallbladder enlargement and periportal and celiac axis lymphadenopathy. 4. No lytic or sclerotic bone lesions to suggest osseous  metastatic disease. Aortic Atherosclerosis (ICD10-I70.0). Electronically Signed   By: Marijo Sanes M.D.   On: 06/22/2019 15:01      Scheduled Meds:  acetaminophen  650 mg Oral Once   Chlorhexidine Gluconate Cloth  6 each Topical Q0600   enoxaparin (LOVENOX) injection  40 mg Subcutaneous Q24H   mupirocin ointment  1 application Nasal BID   pantoprazole  40 mg Oral Daily   polyethylene glycol  17 g Oral Daily   Continuous Infusions:  sodium chloride 0.9 % 1,000 mL with potassium chloride 20 mEq infusion       LOS: 3 days      Time spent: 25 minutes   Dessa Phi, DO [facility] [email or web address] 06/24/2019, 1:08 PM

## 2019-06-24 NOTE — Progress Notes (Signed)
Lab called positive MRSA swab results. Positive MRSA standing orders initiated and MD notified.

## 2019-06-24 NOTE — Anesthesia Preprocedure Evaluation (Signed)
Anesthesia Evaluation  Patient identified by MRN, date of birth, ID band Patient awake    Reviewed: Allergy & Precautions, NPO status , Patient's Chart, lab work & pertinent test results  Airway Mallampati: II  TM Distance: >3 FB Neck ROM: Full    Dental no notable dental hx.    Pulmonary neg pulmonary ROS,    Pulmonary exam normal breath sounds clear to auscultation       Cardiovascular negative cardio ROS Normal cardiovascular exam Rhythm:Regular Rate:Normal     Neuro/Psych negative neurological ROS  negative psych ROS   GI/Hepatic negative GI ROS, Neg liver ROS,   Endo/Other  negative endocrine ROS  Renal/GU negative Renal ROS  negative genitourinary   Musculoskeletal negative musculoskeletal ROS (+)   Abdominal   Peds negative pediatric ROS (+)  Hematology negative hematology ROS (+)   Anesthesia Other Findings   Reproductive/Obstetrics negative OB ROS                             Anesthesia Physical Anesthesia Plan  ASA: III  Anesthesia Plan: General   Post-op Pain Management:    Induction: Intravenous and Rapid sequence  PONV Risk Score and Plan: 3 and Ondansetron, Dexamethasone and Treatment may vary due to age or medical condition  Airway Management Planned: Oral ETT  Additional Equipment:   Intra-op Plan:   Post-operative Plan: Extubation in OR  Informed Consent: I have reviewed the patients History and Physical, chart, labs and discussed the procedure including the risks, benefits and alternatives for the proposed anesthesia with the patient or authorized representative who has indicated his/her understanding and acceptance.     Dental advisory given  Plan Discussed with: CRNA and Surgeon  Anesthesia Plan Comments:         Anesthesia Quick Evaluation

## 2019-06-24 NOTE — Op Note (Signed)
06/24/2019  PATIENT:  Rachel Hughes  85 y.o. female  Patient Care Team: Vivi Barrack, MD as PCP - General (Family Medicine)  PRE-OPERATIVE DIAGNOSIS:    Acute on Chronic Calculus Cholecystitis  Portal lymphadenopathy  POST-OPERATIVE DIAGNOSIS:   Acute on Chronic Calculus Cholecystitis Portal lymphadenopathy   PROCEDURE:  Laparoscopic lysis of adhesions x 60 min Laparoscopic cholecystectomy  Excisional biopsy porta hepatis lymph nodes  SURGEON:  Adin Hector, MD, FACS.  ASSISTANT: Brigid Re, PA-C   ANESTHESIA:    General with endotracheal intubation Local anesthetic as a field block  EBL:  (See Anesthesia Intraoperative Record) Total I/O In: 1000 [I.V.:1000] Out: 300 [Blood:300]   Delay start of Pharmacological VTE agent (>24hrs) due to surgical blood loss or risk of bleeding:  no  DRAINS: 19 Fr Blake closed bulb drain in the RUQ  through a right lateral port site, retrohepatic, over the cystic duct stump, and over the lesser curvature the stomach past point of portal LN dissection  SPECIMEN: 1.  Gallbladder.  2.  Porta hepatis lymph nodes    DISPOSITION OF SPECIMEN:  PATHOLOGY  COUNTS:  YES  PLAN OF CARE: Admit for overnight observation  PATIENT DISPOSITION:  PACU - hemodynamically stable.  INDICATION: Pleasant only woman originally from [place] has had some upper abdominal pain with constipation.  Work-up did show some gallstones.  Story equivocal but then became more consistent with biliary colic with Percell Miller sign.  Seen by gastroenterology without any definite proof of ulcers.  Concerned that some enlarged lymph nodes in the porta hepatis.  Hard to know if reactionary versus metastatic.  Patient is newly diagnosed breast mass suspicious for breast cancer.  Not typical of metastatic pattern.  Recommendation made to the patient and her daughter for laparoscopic gallbladder and possible lymph node biopsies.  The anatomy & physiology of hepatobiliary & pancreatic  function was discussed.  The pathophysiology of gallbladder dysfunction was discussed.  Natural history risks without surgery was discussed.   I feel the risks of no intervention will lead to serious problems that outweigh the operative risks; therefore, I recommended cholecystectomy to remove the pathology.  I explained laparoscopic techniques with possible need for an open approach.  Probable cholangiogram to evaluate the bilary tract was explained as well.    Risks such as bleeding, infection, abscess, leak, injury to other organs, need for further treatment, heart attack, death, and other risks were discussed.  I noted a good likelihood this will help address the problem.  Possibility that this will not correct all abdominal symptoms was explained.  Goals of post-operative recovery were discussed as well.  We will work to minimize complications.  An educational handout further explaining the pathology and treatment options was given as well.  Questions were answered.  The patient expresses understanding & wishes to proceed with surgery.  OR FINDINGS: Very thickened gallbladder with dense phlegmon.  Densely adhesed duodenal bulb to the gallbladder.  Enlarged lymph node of [place].  Fatty change in liver  Liver: Fatty steatohepatitis   Enlarged porta hepatis lymph nodes excised & largest samples for biopsy.  DESCRIPTION:   Informed consent was confirmed.  The patient underwent general anaesthesia without difficulty.  The patient was positioned appropriately.  VTE prevention in place.  The patient's abdomen was clipped, prepped, & draped in a sterile fashion.  Surgical timeout confirmed our plan.  Peritoneal entry with a laparoscopic port was obtained using noncontrast technique supraumbilically.  Entry was clean.  I induced carbon dioxide  insufflation.  Camera inspection revealed no injury.  Extra ports were carefully placed under direct laparoscopic visualization.  I turned attention to the right  upper quadrant.  Gallbladder was enlarged with a thickening.  Extremely dense adhesions of duodenal bulb to it.  Cannot be grasped.  Using needle aspiration to help decompress it.  Very inflamed bloody.  Gradually worked in layers to free the greater omentum and transverse colon off the gallbladder and a right lateral to midline medial fashion.  Mobilized the gallbladder off its attachments to the liver bed from the infundibulum up towards the apex along the right posterior lateral side.  Came down to a dome down fashion as well.  Initially the gallbladder and freed most of it off the liver bed.  Allowed me to help stretch out carefully freed off the duodenal bulb stents adhesions to the gallbladder anterior wall.  Seem more inflammatory with some chronic adhesions to it.  Due to some focused Hydro dissection as well as Harmonic scalpel.  Eventually came down and found a less dominant posterior cystic arterial branch on the posterior infundibulum.  Skeletonized and transected chronic scalpel.  Not a more durable candidate for the main cystic artery and its anterior branch on the medial side of the gallbladder.  Once we skeletonized and got a good critical view.  Hemostasis and came around it chronic.  Eventually freed the wrist the gallbladder out such that the only thing remaining was a solitary tubular structure consistent with a cystic duct.  Was coming to a larger vertebral fixed retroperitoneal tubular structure consistent with a common bile duct.  Skeletonizing isolated to ligated using 0 PDS Endoloops x2.  Not a good lassoed around the first 1 towards the cystic/common bile duct base.  Then transected the infundibulum off the cystic duct and did not ligation the incontinence or cystic duct junction.  Is been rather bloody doing a dissection since the bowel was extremely inflamed, once the gallbladder was freed off, Hemostasis was markedly improved Copious irrigation of several liters, gradually removing some  old blood clots with clear return at the end  I focused on the for lymph node.  Able to come around the distal antrum to the crows foot and come through the hepatogastric ligament.  Encountered some large ellipsoid soft rubbery grey masses consistent with enlarged lymph nodes.  They seem softer on the right more recently friable.  Not directly hard to rocky.  More inflammatory.  I dissected around them to elevate, skeletonize, and transect them using focused dissection and harmonic scalpel.  Did have to bivalve the largest 1 to safely get around it.  Sent the lymph nodes in separate specimen bag and removed out the epigastric 10 mm port.  Placed the gallbladder separated catch bag and removed it as well.  Opened the subxiphoid fascia to 3 cm to get the gallbladder with its very thickened wall out.  We focused on copious irrigation and assured hemostasis of liver bed and gallbladder fossa.  Confirmed the anatomy look good for the cystic duct arterial stump.  Was pulsatile but not bleeding.  The cystic duct stump with infundibular collar had good ligation with no leaking of bile.  Because of the significant inflammation and ooziness, we decided to leave ae drain through a right lateral port site and laid open the porta hepatis near the cystic duct stump is already porta hepatis towards the lesser curvature the stomach.  Through the skin with 2-0 Prolene suture.  Irrigation with much clear  return.  Gas was evacuated.  I closed the subxiphoid fascia transversely using #1 PDS interrupted stitches. I closed the skin using 4-0 monocryl stitch.  Sterile dressings were applied. The patient was extubated & arrived in the PACU in stable condition..  I had discussed postoperative care with the patient in the holding area. I discussed operative findings, updated the patient's status, discussed probable steps to recovery, and gave postoperative recommendations to the patient's family.  Recommendations were made.  Questions  were answered.  She expressed understanding & appreciation.  Adin Hector, M.D., F.A.C.S. Gastrointestinal and Minimally Invasive Surgery [facility] [address], [place],  [address] [phone] Main / Paging  06/24/2019 5:38 PM

## 2019-06-24 NOTE — Interval H&P Note (Signed)
History and Physical Interval Note:  06/24/2019 2:32 PM  Rachel Hughes  has presented today for surgery, with the diagnosis of chronic cholecystitis.  The various methods of treatment have been discussed with the patient and family. After consideration of risks, benefits and other options for treatment, the patient has consented to  Procedure(s): LAPAROSCOPIC CHOLECYSTECTOMY WITH INTRAOPERATIVE CHOLANGIOGRAM WITH LYMPH NODE BIOPSY (N/A) as a surgical intervention.  The patient's history has been reviewed, patient examined, no change in status, stable for surgery.  I have reviewed the patient's chart and labs.  D/w Dr Burr Medico.  D/w patient & her daughter who preferred to be her mother's interpreter.  Questions were answered to the patient's satisfaction.    I have re-reviewed the the patient's records, history, medications, and allergies.  I have re-examined the patient.  I again discussed intraoperative plans and goals of post-operative recovery.  The patient agrees to proceed.  Carson  [birth year] [id number]  Patient Care Team: Vivi Barrack, MD as PCP - General (Family Medicine)  Patient Active Problem List   Diagnosis Date Noted  . Gallbladder sludge 06/22/2019  . Pancreatic mass 06/22/2019  . Mass of breast, left 06/22/2019  . Abdominal pain 06/21/2019    History reviewed. No pertinent past medical history.  Past Surgical History:  Procedure Laterality Date  . CESAREAN SECTION    . FRACTURE SURGERY    . Left Hip Surgery Left     Social History   Socioeconomic History  . Marital status: Widowed    Spouse name: Not on file  . Number of children: 2  . Years of education: Not on file  . Highest education level: Not on file  Occupational History  . Occupation: Retired  Scientific laboratory technician  . Financial resource strain: Not on file  . Food insecurity    Worry: Not on file    Inability: Not on file  . Transportation needs    Medical: Not on file    Non-medical: Not on file  Tobacco  Use  . Smoking status: Never Smoker  . Smokeless tobacco: Never Used  Substance and Sexual Activity  . Alcohol use: No    Frequency: Never  . Drug use: No  . Sexual activity: Never  Lifestyle  . Physical activity    Days per week: Not on file    Minutes per session: Not on file  . Stress: Not on file  Relationships  . Social Herbalist on phone: Not on file    Gets together: Not on file    Attends religious service: Not on file    Active member of club or organization: Not on file    Attends meetings of clubs or organizations: Not on file    Relationship status: Not on file  . Intimate partner violence    Fear of current or ex partner: Not on file    Emotionally abused: Not on file    Physically abused: Not on file    Forced sexual activity: Not on file  Other Topics Concern  . Not on file  Social History Narrative  . Not on file    Family History  Problem Relation Age of Onset  . Arthritis Daughter   . Arthritis Son   . Heart disease Son     Medications Prior to Admission  Medication Sig Dispense Refill Last Dose  . acetaminophen (TYLENOL) 500 MG tablet Take 500-1,000 mg by mouth every 6 (six) hours as needed for  mild pain or moderate pain.   06/21/2019 at PRN  . Calcium Carbonate-Vitamin D (CALCIUM 600+D HIGH POTENCY) 600-400 MG-UNIT tablet Take 1 tablet by mouth daily.   Past Week at Unknown time  . Multiple Vitamin (MULTIVITAMIN WITH MINERALS) TABS tablet Take 1 tablet by mouth daily.   Past Week at Unknown time  . omeprazole (PRILOSEC OTC) 20 MG tablet Take 20 mg by mouth daily.   Past Week at Unknown time    Current Facility-Administered Medications  Medication Dose Route Frequency Provider Last Rate Last Dose  . [MAR Hold] acetaminophen (TYLENOL) tablet 650 mg  650 mg Oral Once Nanavati, Ankit, MD      . bupivacaine liposome (EXPAREL) 1.3 % injection 266 mg  20 mL Infiltration To OR Dessa Phi, DO      . ceFAZolin (ANCEF) IVPB 2g/100 mL premix  2  g Intravenous On Call to OR Dessa Phi, DO       And  . metroNIDAZOLE (FLAGYL) IVPB 500 mg  500 mg Intravenous On Call to OR Dessa Phi, DO      . [MAR Hold] Chlorhexidine Gluconate Cloth 2 % PADS 6 each  6 each Topical Q0600 Dessa Phi, DO      . Chlorhexidine Gluconate Cloth 2 % PADS 6 each  6 each Topical Once Michael Boston, MD      . Doug Sou Hold] diphenhydrAMINE (BENADRYL) 12.5 MG/5ML elixir 12.5 mg  12.5 mg Oral Q6H PRN Leodis Sias T, RPH       Or  . Doug Sou Hold] diphenhydrAMINE (BENADRYL) injection 12.5 mg  12.5 mg Intravenous Q6H PRN Eudelia Bunch, RPH      . [MAR Hold] enoxaparin (LOVENOX) injection 40 mg  40 mg Subcutaneous Q24H Jani Gravel, MD   40 mg at 06/23/19 0959  . [MAR Hold] fentaNYL (SUBLIMAZE) injection 25 mcg  25 mcg Intravenous Q2H PRN Dessa Phi, DO      . [MAR Hold] HYDROcodone-acetaminophen (NORCO/VICODIN) 5-325 MG per tablet 1-2 tablet  1-2 tablet Oral Q6H PRN Dessa Phi, DO      . [MAR Hold] mupirocin ointment (BACTROBAN) 2 % 1 application  1 application Nasal BID Dessa Phi, DO   1 application at 67/20/94 1405  . [MAR Hold] ondansetron (ZOFRAN) injection 4 mg  4 mg Intravenous Q6H PRN Jani Gravel, MD      . Doug Sou Hold] pantoprazole (PROTONIX) EC tablet 40 mg  40 mg Oral Daily Leodis Sias T, RPH   40 mg at 06/24/19 1404  . [MAR Hold] polyethylene glycol (MIRALAX / GLYCOLAX) packet 17 g  17 g Oral Daily Brahmbhatt, Parag, MD   17 g at 06/23/19 0959  . sodium chloride 0.9 % 1,000 mL with potassium chloride 20 mEq infusion   Intravenous Continuous Dessa Phi, DO         No Known Allergies  BP 116/76 (BP Location: Right Arm)   Pulse 70   Temp 99.4 F (37.4 C) (Oral)   Resp 17   Wt 63.2 kg   SpO2 93%   BMI 27.21 kg/m   Labs: Results for orders placed or performed during the hospital encounter of 06/21/19 (from the past 48 hour(s))  Surgical pcr screen     Status: Abnormal   Collection Time: 06/24/19  4:00 AM   Specimen: Nasal  Mucosa; Nasal Swab  Result Value Ref Range   MRSA, PCR POSITIVE (A) NEGATIVE    Comment: RESULT CALLED TO, READ BACK BY AND VERIFIED WITH: BETHEL AT 7096 ON  06/24/2019 BY JPM    Staphylococcus aureus POSITIVE (A) NEGATIVE    Comment: (NOTE) The Xpert SA Assay (FDA approved for NASAL specimens in patients 35 years of age and older), is one component of a comprehensive surveillance program. It is not intended to diagnose infection nor to guide or monitor treatment. Performed at [facility], [place] [address], [place], [place] [address]   CBC     Status: None   Collection Time: 06/24/19  5:37 AM  Result Value Ref Range   WBC 7.6 4.0 - 10.5 K/uL   RBC 3.98 3.87 - 5.11 MIL/uL   Hemoglobin 12.1 12.0 - 15.0 g/dL   HCT 37.6 36.0 - 46.0 %   MCV 94.5 80.0 - 100.0 fL   MCH 30.4 26.0 - 34.0 pg   MCHC 32.2 30.0 - 36.0 g/dL   RDW 12.9 11.5 - 15.5 %   Platelets 253 150 - 400 K/uL   nRBC 0.0 0.0 - 0.2 %    Comment: Performed at [facility], [place] [address], [place], [place] [address]  Comprehensive metabolic panel     Status: Abnormal   Collection Time: 06/24/19  5:37 AM  Result Value Ref Range   Sodium 139 135 - 145 mmol/L   Potassium 3.4 (L) 3.5 - 5.1 mmol/L   Chloride 104 98 - 111 mmol/L   CO2 24 22 - 32 mmol/L   Glucose, Bld 114 (H) 70 - 99 mg/dL   BUN 14 8 - 23 mg/dL   Creatinine, Ser 0.70 0.44 - 1.00 mg/dL   Calcium 8.7 (L) 8.9 - 10.3 mg/dL   Total Protein 7.4 6.5 - 8.1 g/dL   Albumin 3.5 3.5 - 5.0 g/dL   AST 20 15 - 41 U/L   ALT 15 0 - 44 U/L   Alkaline Phosphatase 68 38 - 126 U/L   Total Bilirubin 1.1 0.3 - 1.2 mg/dL   GFR calc non Af Amer >60 >60 mL/min   GFR calc Af Amer >60 >60 mL/min   Anion gap 11 5 - 15    Comment: Performed at [facility], [place] [address], [place], [place] [address]    Imaging / Studies: Ct Chest Wo Contrast  Result Date: 06/22/2019 CLINICAL DATA:  Metastatic breast cancer. EXAM: CT CHEST  WITHOUT CONTRAST TECHNIQUE: Multidetector CT imaging of the chest was performed following the standard protocol without IV contrast. COMPARISON:  CT abdomen/pelvis from yesterday. FINDINGS: Cardiovascular: The heart is within normal limits in size. Small amount of pericardial fluid without overt effusion. There is mild tortuosity, ectasia and calcification of the thoracic aorta. Remarkably little coronary artery calcifications for age. Mediastinum/Nodes: Small scattered mediastinal and hilar lymph nodes but no mass or overt adenopathy. Calcified left hilar lymph nodes are noted. The esophagus is grossly normal. Lungs/Pleura: No obvious pulmonary nodules to suggest pulmonary metastatic disease. No acute pulmonary findings. No pleural effusion. Upper Abdomen: Distended gallbladder again demonstrated along with periportal and celiac axis lymphadenopathy. Musculoskeletal: 2 adjacent rounded masses are noted in the left breast. Small scattered left axillary lymph nodes but no overt adenopathy. No subpectoral or supraclavicular adenopathy. The thyroid gland is grossly normal. No lytic or sclerotic bone lesions to suggest osseous metastatic disease. IMPRESSION: 1. Two adjacent rounded soft tissue masses in the left breast suspicious for breast cancer. No definite left axillary or supraclavicular adenopathy. 2. No CT findings for metastatic disease involving the chest/lungs. 3. Stable gallbladder enlargement and periportal and celiac axis lymphadenopathy. 4.  No lytic or sclerotic bone lesions to suggest osseous metastatic disease. Aortic Atherosclerosis (ICD10-I70.0). Electronically Signed   By: Marijo Sanes M.D.   On: 06/22/2019 15:01   Ct Abdomen Pelvis W Contrast  Result Date: 06/21/2019 CLINICAL DATA:  Abdominal pain.  Abscess suspected. EXAM: CT ABDOMEN AND PELVIS WITH CONTRAST TECHNIQUE: Multidetector CT imaging of the abdomen and pelvis was performed using the standard protocol following bolus administration of  intravenous contrast. CONTRAST:  111mL OMNIPAQUE IOHEXOL 300 MG/ML  SOLN COMPARISON:  None. FINDINGS: Lower chest: The lung bases are clear. The heart size is normal. There are multiple masses that appear to be centered within the left breast tissue. The largest measures approximately 2.6 cm. Hepatobiliary: The liver is normal. The gallbladder is distended with some mild gallbladder wall thickening. There is likely gallbladder sludge.There is no biliary ductal dilation. Pancreas: Normal contours without ductal dilatation. No peripancreatic fluid collection. Spleen: No splenic laceration or hematoma. Adrenals/Urinary Tract: --Adrenal glands: No adrenal hemorrhage. --Right kidney/ureter: There is some mild cortical thinning of the upper pole the right kidney. --Left kidney/ureter: No hydronephrosis or perinephric hematoma. --Urinary bladder: Unremarkable. Stomach/Bowel: --Stomach/Duodenum: No hiatal hernia or other gastric abnormality. Normal duodenal course and caliber. --Small bowel: No dilatation or inflammation. --Colon: No focal abnormality. --Appendix: Not visualized. No right lower quadrant inflammation or free fluid. Vascular/Lymphatic: Atherosclerotic calcification is present within the non-aneurysmal abdominal aorta, without hemodynamically significant stenosis. --are no pathologically enlarged retroperitoneal lymph nodes. There is a 3.2 cm heterogeneous mass adjacent to the pancreatic body and left hepatic lobe. There is an additional 2.5 cm heterogeneous mass adjacent to the pancreatic head. --No mesenteric lymphadenopathy. --No pelvic or inguinal lymphadenopathy. Reproductive: Unremarkable Other: No ascites or free air. The abdominal wall is normal. Musculoskeletal. There is a unilateral pars defect at L5 on the left without evidence for significant anterolisthesis. The patient is status post total hip arthroplasty on the left. IMPRESSION: 1. Distended gallbladder with gallbladder wall thickening raising  concern for acute cholecystitis. Follow-up with ultrasound is recommended. 2. Multiple masses are noted in the left breast tissue. Findings are concerning for underlying breast carcinoma. Correlation with outpatient mammographies recommended. 3. A 3.2 cm and a 2.5 cm mass are noted adjacent to the pancreatic body and head. Findings are concerning for pathologically enlarged lymph nodes. Nodal metastatic disease is not excluded. Follow-up is recommended. Electronically Signed   By: Constance Holster M.D.   On: 06/21/2019 22:34   US Abdomen Limited Ruq  Result Date: 06/21/2019 CLINICAL DATA:  Right upper quadrant pain EXAM: ULTRASOUND ABDOMEN LIMITED RIGHT UPPER QUADRANT COMPARISON:  None. FINDINGS: Gallbladder: Large amount of sludge within the gallbladder. Gallbladder wall is borderline thickened at 3.6 mm. Negative sonographic Murphy's. No visible stones. Common bile duct: Diameter: Normal caliber, 4 mm Liver: No focal lesion identified. Within normal limits in parenchymal echogenicity. Portal vein is patent on color Doppler imaging with normal direction of blood flow towards the liver. Other: None. IMPRESSION: Large amount of sludge within the gallbladder with borderline gallbladder wall thickness. No visible stones or sonographic Murphy sign. Electronically Signed   By: Rolm Baptise M.D.   On: 06/21/2019 23:54     .Adin Hector, M.D., F.A.C.S. Gastrointestinal and Minimally Invasive Surgery [facility] [address] [place], [place] [address] [phone] Main / Paging  06/24/2019 2:33 PM     Adin Hector

## 2019-06-25 ENCOUNTER — Encounter: Payer: Self-pay | Admitting: *Deleted

## 2019-06-25 ENCOUNTER — Encounter (HOSPITAL_COMMUNITY): Payer: Self-pay | Admitting: Surgery

## 2019-06-25 LAB — COMPREHENSIVE METABOLIC PANEL
ALT: 32 U/L (ref 0–44)
AST: 80 U/L — ABNORMAL HIGH (ref 15–41)
Albumin: 2.7 g/dL — ABNORMAL LOW (ref 3.5–5.0)
Alkaline Phosphatase: 49 U/L (ref 38–126)
Anion gap: 6 (ref 5–15)
BUN: 14 mg/dL (ref 8–23)
CO2: 24 mmol/L (ref 22–32)
Calcium: 8.2 mg/dL — ABNORMAL LOW (ref 8.9–10.3)
Chloride: 106 mmol/L (ref 98–111)
Creatinine, Ser: 0.81 mg/dL (ref 0.44–1.00)
GFR calc Af Amer: >60 mL/min (ref >60)
GFR calc non Af Amer: >60 mL/min (ref >60)
Glucose, Bld: 157 mg/dL — ABNORMAL HIGH (ref 70–99)
Potassium: 4.7 mmol/L (ref 3.5–5.1)
Sodium: 136 mmol/L (ref 135–145)
Total Bilirubin: 0.7 mg/dL (ref 0.3–1.2)
Total Protein: 5.9 g/dL — ABNORMAL LOW (ref 6.5–8.1)

## 2019-06-25 LAB — CBC
HCT: 30.9 % — ABNORMAL LOW (ref 36.0–46.0)
HCT: 29.9 % — ABNORMAL LOW (ref 36.0–46.0)
Hemoglobin: 9.8 g/dL — ABNORMAL LOW (ref 12.0–15.0)
Hemoglobin: 9.6 g/dL — ABNORMAL LOW (ref 12.0–15.0)
MCH: 30.2 pg (ref 26.0–34.0)
MCH: 30.1 pg (ref 26.0–34.0)
MCHC: 31.7 g/dL (ref 30.0–36.0)
MCHC: 32.1 g/dL (ref 30.0–36.0)
MCV: 95.4 fL (ref 80.0–100.0)
MCV: 93.7 fL (ref 80.0–100.0)
Platelets: 241 10*3/uL (ref 150–400)
Platelets: 241 10*3/uL (ref 150–400)
RBC: 3.24 MIL/uL — ABNORMAL LOW (ref 3.87–5.11)
RBC: 3.19 MIL/uL — ABNORMAL LOW (ref 3.87–5.11)
RDW: 12.8 % (ref 11.5–15.5)
RDW: 12.8 % (ref 11.5–15.5)
WBC: 9.1 10*3/uL (ref 4.0–10.5)
WBC: 12.3 10*3/uL — ABNORMAL HIGH (ref 4.0–10.5)
nRBC: 0 % (ref 0.0–0.2)
nRBC: 0 % (ref 0.0–0.2)

## 2019-06-25 LAB — MAGNESIUM: Magnesium: 2.2 mg/dL (ref 1.7–2.4)

## 2019-06-25 MED ORDER — POTASSIUM CHLORIDE IN NACL 20-0.9 MEQ/L-% IV SOLN
INTRAVENOUS | Status: DC
  Administered 2019-06-25: 08:00:00 via INTRAVENOUS
  Filled 2019-06-25: qty 1000

## 2019-06-25 NOTE — Progress Notes (Signed)
[facility]/Trauma Progress Note  1 Day Post-Op   Assessment/Plan Pancreatic lymphadenopathy Left breast mass - oncology following  Gallbladder distension and sludge, RUQ pain - chronic cholecystitis  - S/P lap chole, LOA, excisional biopsy porta hepatis lymph nodes, Dr. Johney Maine, 08/03 - path pending - advance diet as tolerated, ambulate  FEN: FLD and advance as tolerated VTE: SCD's, lovenox ID: [email or web address] 07/31>> Foley: none Follow up: TBD  DISPO: pt needs to ambulate. Advance diet as tolerated   LOS: 4 days    Subjective: CC: RUQ abdominal pain  Per daughter, pt is very tired and weak. Pt denies nausea or vomiting. She tolerated broth and apple juice. She is urinating. No flatus per daughter.   Objective: Vital signs in last 24 hours: Temp:  [96.9 F (36.1 C)-98.5 F (36.9 C)] 97.6 F (36.4 C) (08/04 5643) Pulse Rate:  [70-88] 70 (08/04 0633) Resp:  [11-18] 16 (08/04 0633) BP: (90-141)/(58-70) 95/61 (08/04 0633) SpO2:  [95 %-100 %] 95 % (08/04 3295) Weight:  [63.2 kg-63.3 kg] 63.3 kg (08/04 0500) Last BM Date: 06/23/19  Intake/Output from previous day: 08/03 0701 - 08/04 0700 In: 1500 [I.V.:1500] Out: 470 [Drains:170; Blood:300] Intake/Output this shift: No intake/output data recorded.  PE: Gen:  Alert, NAD, pleasant, cooperative Pulm:  Rate and effort normal Abd: Soft, ND, +BS, incisions C/D/I, drain with minimal anguinous drainage, TTP right hemiabdomen with guarding. No peritonitis  Skin: no rashes noted, warm and dry   Anti-infectives: Anti-infectives (From admission, onward)   Start     Dose/Rate Route Frequency Ordered Stop   06/25/19 0600  ceFAZolin (ANCEF) IVPB 2g/100 mL premix  Status:  Discontinued     2 g 200 mL/hr over 30 Minutes Intravenous On call to O.R. 06/24/19 1324 06/24/19 1402   06/25/19 0600  metroNIDAZOLE (FLAGYL) IVPB 500 mg  Status:  Discontinued     500 mg 100 mL/hr over 60 Minutes Intravenous On call to O.R.  06/24/19 1324 06/24/19 1402   06/24/19 2200  piperacillin-tazobactam (ZOSYN) IVPB 3.375 g     3.375 g 12.5 mL/hr over 240 Minutes Intravenous Every 8 hours 06/24/19 2004     06/24/19 1415  ceFAZolin (ANCEF) IVPB 2g/100 mL premix     2 g 200 mL/hr over 30 Minutes Intravenous On call to O.R. 06/24/19 1402 06/24/19 1507   06/24/19 1415  metroNIDAZOLE (FLAGYL) IVPB 500 mg     500 mg 100 mL/hr over 60 Minutes Intravenous On call to O.R. 06/24/19 1402 06/24/19 1528   06/22/19 0600  piperacillin-tazobactam (ZOSYN) IVPB 3.375 g  Status:  Discontinued     3.375 g 12.5 mL/hr over 240 Minutes Intravenous Every 8 hours 06/22/19 0527 06/22/19 1211   06/21/19 2315  piperacillin-tazobactam (ZOSYN) IVPB 3.375 g     3.375 g 100 mL/hr over 30 Minutes Intravenous  Once 06/21/19 2306 06/22/19 0014      Lab Results:  Recent Labs    06/24/19 0537 06/25/19 0510  WBC 7.6 9.1  HGB 12.1 9.8*  HCT 37.6 30.9*  PLT 253 241   BMET Recent Labs    06/24/19 0537 06/25/19 0510  NA 139 136  K 3.4* 4.7  CL 104 106  CO2 24 24  GLUCOSE 114* 157*  BUN 14 14  CREATININE 0.70 0.81  CALCIUM 8.7* 8.2*   PT/INR No results for input(s): LABPROT, INR in the last 72 hours. CMP     Component Value Date/Time   NA 136 06/25/2019 0510   K 4.7 06/25/2019  0510   CL 106 06/25/2019 0510   CO2 24 06/25/2019 0510   GLUCOSE 157 (H) 06/25/2019 0510   BUN 14 06/25/2019 0510   CREATININE 0.81 06/25/2019 0510   CALCIUM 8.2 (L) 06/25/2019 0510   PROT 5.9 (L) 06/25/2019 0510   ALBUMIN 2.7 (L) 06/25/2019 0510   AST 80 (H) 06/25/2019 0510   ALT 32 06/25/2019 0510   ALKPHOS 49 06/25/2019 0510   BILITOT 0.7 06/25/2019 0510   GFRNONAA >60 06/25/2019 0510   GFRAA >60 06/25/2019 0510   Lipase     Component Value Date/Time   LIPASE 34 06/21/2019 1322    Studies/Results: No results found.    Kalman Drape , [facility] 06/25/2019, 9:17 AM  Pager: [phone] Mon-Wed, Friday  7:00am-4:30pm Thurs 7am-11:30am  Consults: [phone]

## 2019-06-25 NOTE — Discharge Instructions (Signed)
[facility] ______[facility] °LAPAROSCOPIC SURGERY: POST OP INSTRUCTIONS °Always review your discharge instruction sheet given to you by the facility where your surgery was performed. °IF YOU HAVE DISABILITY OR FAMILY LEAVE FORMS, YOU MUST BRING THEM TO THE OFFICE FOR PROCESSING.   °DO NOT GIVE THEM TO YOUR DOCTOR. ° °1. A prescription for pain medication may be given to you upon discharge.  Take your pain medication as prescribed, if needed.  If narcotic pain medicine is not needed, then you may take acetaminophen (Tylenol) or ibuprofen (Advil) as needed. °2. Take your usually prescribed medications unless otherwise directed. °3. If you need a refill on your pain medication, please contact your pharmacy.  They will contact our office to request authorization. Prescriptions will not be filled after 5pm or on week-ends. °4. You should follow a light diet the first few days after arrival home, such as soup and crackers, etc.  Be sure to include lots of fluids daily. °5. Most patients will experience some swelling and bruising in the area of the incisions.  Ice packs will help.  Swelling and bruising can take several days to resolve.  °6. It is common to experience some constipation if taking pain medication after surgery.  Increasing fluid intake and taking a stool softener (such as Colace) will usually help or prevent this problem from occurring.  A mild laxative (Milk of Magnesia or Miralax) should be taken according to package instructions if there are no bowel movements after 48 hours. °7. Unless discharge instructions indicate otherwise, you may remove your bandages 24-48 hours after surgery, and you may shower at that time.  You may have steri-strips (small skin tapes) in place directly over the incision.  These strips should be left on the skin for 7-10 days.  If your surgeon used skin glue on the incision, you may shower in 24 hours.  The glue will flake off over the next 2-3 weeks.  Any sutures or  staples will be removed at the office during your follow-up visit. °8. ACTIVITIES:  You may resume regular (light) daily activities beginning the next day--such as daily self-care, walking, climbing stairs--gradually increasing activities as tolerated.  You may have sexual intercourse when it is comfortable.  Refrain from any heavy lifting or straining until approved by your doctor. °a. You may drive when you are no longer taking prescription pain medication, you can comfortably wear a seatbelt, and you can safely maneuver your car and apply brakes. °b. RETURN TO WORK:  __________________________________________________________ °9. You should see your doctor in the office for a follow-up appointment approximately 2-3 weeks after your surgery.  Make sure that you call for this appointment within a day or two after you arrive home to insure a convenient appointment time. °10. OTHER INSTRUCTIONS: __________________________________________________________________________________________________________________________ __________________________________________________________________________________________________________________________ °WHEN TO CALL YOUR DOCTOR: °1. Fever over 101.0 °2. Inability to urinate °3. Continued bleeding from incision. °4. Increased pain, redness, or drainage from the incision. °5. Increasing abdominal pain ° °The clinic staff is available to answer your questions during regular business hours.  Please don’t hesitate to call and ask to speak to one of the nurses for clinical concerns.  If you have a medical emergency, go to the nearest emergency room or call 911.  A surgeon from [facility] is always on call at the hospital. °[address], [address], [place], [place]  [address] ? [address] [address], [place],    [address] °[phone] ? [phone] ? FAX [phone] °Web site:   [email or web address]    [facility] Surgical drains are used to  remove extra fluid that normally builds up in a surgical wound after surgery. A surgical drain helps to heal a surgical wound. Different kinds of surgical drains include:  Active drains. These drains use suction to pull drainage away from the surgical wound. Drainage flows through a tube to a container outside of the body. With these drains, you need to keep the bulb or the drainage container flat (compressed) at all times, except while you empty it. Flattening the bulb or container creates suction.  Passive drains. These drains allow fluid to drain naturally, by gravity. Drainage flows through a tube to a bandage (dressing) or a container outside of the body. Passive drains do not need to be emptied. A drain is placed during surgery. Right after surgery, drainage is usually bright red and a little thicker than water. The drainage may gradually turn yellow or pink and become thinner. It is likely that your health care provider will remove the drain when the drainage stops or when the amount decreases to 1-2 Tbsp (15-30 mL) during a 24-hour period. Supplies needed:  Tape.  Germ-free cleaning solution (sterile saline).  Cotton swabs.  Split gauze drain sponge: 4 x 4 inches (10 x 10 cm).  Gauze square: 4 x 4 inches (10 x 10 cm). How to care for your surgical drain Care for your drain as told by your health care provider. This is important to help prevent infection. If your drain is placed at your back, or any other hard-to-reach area, ask another person to assist you in performing the following tasks: General care  Keep the skin around the drain dry and covered with a dressing at all times.  Check your drain area every day for signs of infection. Check for: ? Redness, swelling, or pain. ? Pus or a bad smell. ? Cloudy drainage. ? Tenderness or pressure at the drain exit site. Changing the dressing Follow instructions from your health care provider about how to change your dressing. Change  your dressing at least once a day. Change it more often if needed to keep the dressing dry. Make sure you: 1. Gather your supplies. 2. Wash your hands with soap and water before you change your dressing. If soap and water are not available, use hand sanitizer. 3. Remove the old dressing. Avoid using scissors to do that. 4. Wash your hands with soap and water again after removing the old dressing. 5. Use sterile saline to clean your skin around the drain. You may need to use a cotton swab to clean the skin. 6. Place the tube through the slit in a drain sponge. Place the drain sponge so that it covers your wound. 7. Place the gauze square or another drain sponge on top of the drain sponge that is on the wound. Make sure the tube is between those layers. 8. Tape the dressing to your skin. 9. Tape the drainage tube to your skin 1-2 inches (2.5-5 cm) below the place where the tube enters your body. Taping keeps the tube from pulling on any stitches (sutures) that you have. 10. Wash your hands with soap and water. 11. Write down the color of your drainage and how often you change your dressing. How to empty your active drain  1. Make sure that you have a measuring cup that you can empty your drainage into. 2. Wash your hands with soap and water. If soap and water are not  available, use hand sanitizer. 3. Loosen any pins or clips that hold the tube in place. 4. If your health care provider tells you to strip the tube to prevent clots and tube blockages: ? Hold the tube at the skin with one hand. Use your other hand to pinch the tubing with your thumb and first finger. ? Gently move your fingers down the tube while squeezing very lightly. This clears any drainage, clots, or tissue from the tube. ? You may need to do this several times each day to keep the tube clear. Do not pull on the tube. 5. Open the bulb cap or the drain plug. Do not touch the inside of the cap or the bottom of the plug. 6. Turn  the device upside down and gently squeeze. 7. Empty all of the drainage into the measuring cup. 8. Compress the bulb or the container and replace the cap or the plug. To compress the bulb or the container, squeeze it firmly in the middle while you close the cap or plug the container. 9. Write down the amount of drainage that you have in each 24-hour period. If you have less than 2 Tbsp (30 mL) of drainage during 24 hours, contact your health care provider. 10. Flush the drainage down the toilet. 11. Wash your hands with soap and water. Contact a health care provider if: YOUR DRAIN OUTPUT BECOMES GREEN AND/OR  You have redness, swelling, or pain around your drain area.  You have pus or a bad smell coming from your drain area.  You have a fever or chills.  The skin around your drain is warm to the touch.  The amount of drainage that you have is increasing instead of decreasing.  You have drainage that is cloudy.  There is a sudden stop or a sudden decrease in the amount of drainage that you have.  Your drain tube falls out.  Your active drain does not stay compressed after you empty it. Summary  Surgical drains are used to remove extra fluid that normally builds up in a surgical wound after surgery.  Different kinds of surgical drains include active drains and passive drains. Active drains use suction to pull drainage away from the surgical wound, and passive drains allow fluid to drain naturally.  It is important to care for your drain to prevent infection. If your drain is placed at your back, or any other hard-to-reach area, ask another person to assist you.  Contact your health care provider if you have redness, swelling, or pain around your drain area. This information is not intended to replace advice given to you by your health care provider. Make sure you discuss any questions you have with your health care provider. Document Released: 11/04/2000 Document Revised: 12/12/2018  Document Reviewed: 12/12/2018 Elsevier Patient Education  2020 [employer].

## 2019-06-25 NOTE — Progress Notes (Signed)
[facility] Clinical Social Worker  Clinical Social Worker received referral form medical oncology for financial needs.  CSW contacted patients daughter by phone.  Patient is currently inpatient.  Patient does not have insurance and is living with her daughter on a visiting visa.  CSW explained that resources may be limited.  CSW and patients daughter discussed the orange card.  CSW emailed patients daughter the link to the orange card application and information.  CSW also encouraged patient to meet with the financial advocate at [facility] once treatment plan was established to apply for the [facility] and possible financial assistance through [facility].  CSW provided contact information and encouraged patients daughter to call with questions or concerns.    Johnnye Lana, MSW, LCSW, OSW-C Clinical Social Worker [facility] [phone]

## 2019-06-25 NOTE — Progress Notes (Addendum)
PROGRESS NOTE    Rachel Hughes  ERD:[id number] DOB: 1933/03/15 DOA: 06/21/2019 PCP: Vivi Barrack, MD     Brief Narrative:  Rachel Hughes is a 85 year old Mandarin speaking female with no significant past medical history presents with epigastric pain over the past 5 days.  She has had no previous significant medical history, has not seen a physician since 2018.  She has never had a mammogram to her knowledge, if she had it was over 20 years ago likely. She states that she has had some intermittent abdominal pain in the past, always resolved at home but this time it has persisted which prompted her to seek care in the hospital.  Had no other complaints on admission.  In the emergency department, CT abdomen pelvis revealed distended gallbladder with gallbladder wall thickening raising concern for acute cholecystitis, multiple masses in the left breast tissue, mass adjacent to pancreatic body and head.  General surgery was consulted as well as GI, oncology.   New events last 24 hours / Subjective: Daughter is at bedside, able to translate.  Patient underwent lap cholecystectomy yesterday.  Postoperatively, she had severe pain, much better this morning.  Daughter states that patient has not really been able to ambulate as her normal.     Assessment & Plan:   Principal Problem:   Abdominal pain Active Problems:   Gallbladder sludge   Pancreatic mass   Mass of breast, left    Right upper quadrant abdominal pain, chronic cholecystitis -Patient does have tenderness to palpation right upper quadrant -RUQ [place]: Large amount of sludge within gallbladder with borderline gallbladder wall thickness, no visible stones -Status post cholecystectomy lesion, excisional biopsy lymph node, Dr. gross 8/3 -Advance diet as tolerated, per general surgery  New breast mass, ?Pancreatic abnormality -CT abdomen pelvis revealed distended gallbladder with gallbladder wall thickening.  Multiple masses in the left breast  tissue.  3.2 cm and 2.5 cm masses adjacent to the pancreatic body and head -CT chest revealed 2 adjacent rounded soft tissue masses in the left breast suspicious for breast cancer, no CT findings of metastatic disease involving chest/lung, no lytic or sclerotic bone lesion to suggest osseous metastatic disease -GI and IR reviewed patient's imaging, patient's pancreas appeared normal.  IR recommended outpatient mammogram, ultrasound guided biopsy of left breast mass.  Pending work-up, may pursue MRI abdomen with and without contrast with MRCP per GI.  Oncology also evaluated patient, will have close follow-up as an outpatient with biopsy to be scheduled as an outpatient. -Dr. Paulita Fujita, GI, reviewed case and does not feel EUS indicated   DVT prophylaxis: Lovenox  Code Status: Full Family Communication: Daughter at bedside Disposition Plan: Pending clinical improvement   Consultants:   General surgery  GI  Oncology  Procedures:   None  Antimicrobials:  Anti-infectives (From admission, onward)   Start     Dose/Rate Route Frequency Ordered Stop   06/25/19 0600  ceFAZolin (ANCEF) IVPB 2g/100 mL premix  Status:  Discontinued     2 g 200 mL/hr over 30 Minutes Intravenous On call to O.R. 06/24/19 1324 06/24/19 1402   06/25/19 0600  metroNIDAZOLE (FLAGYL) IVPB 500 mg  Status:  Discontinued     500 mg 100 mL/hr over 60 Minutes Intravenous On call to O.R. 06/24/19 1324 06/24/19 1402   06/24/19 2200  piperacillin-tazobactam (ZOSYN) IVPB 3.375 g     3.375 g 12.5 mL/hr over 240 Minutes Intravenous Every 8 hours 06/24/19 2004 06/26/19 1036   06/24/19 1415  ceFAZolin (ANCEF) IVPB 2g/100 mL premix     2 g 200 mL/hr over 30 Minutes Intravenous On call to O.R. 06/24/19 1402 06/24/19 1507   06/24/19 1415  metroNIDAZOLE (FLAGYL) IVPB 500 mg     500 mg 100 mL/hr over 60 Minutes Intravenous On call to O.R. 06/24/19 1402 06/24/19 1528   06/22/19 0600  piperacillin-tazobactam (ZOSYN) IVPB 3.375 g   Status:  Discontinued     3.375 g 12.5 mL/hr over 240 Minutes Intravenous Every 8 hours 06/22/19 0527 06/22/19 1211   06/21/19 2315  piperacillin-tazobactam (ZOSYN) IVPB 3.375 g     3.375 g 100 mL/hr over 30 Minutes Intravenous  Once 06/21/19 2306 06/22/19 0014       Objective: Vitals:   06/24/19 2248 06/25/19 0251 06/25/19 0500 06/25/19 0633  BP: 90/62 (!) 94/58  95/61  Pulse: 80 88  70  Resp: 16 16  16   Temp: 98 F (36.7 C) (!) 97.5 F (36.4 C)  97.6 F (36.4 C)  TempSrc: Oral Oral    SpO2: 95% 96%  95%  Weight:   63.3 kg     Intake/Output Summary (Last 24 hours) at 06/25/2019 1136 Last data filed at 06/25/2019 0958 Gross per 24 hour  Intake 1973 ml  Output 495 ml  Net 1478 ml   Filed Weights   06/24/19 0500 06/24/19 0948 06/25/19 0500  Weight: 63.1 kg 63.2 kg 63.3 kg    Examination: General exam: Appears calm and comfortable  Respiratory system: Clear to auscultation. Respiratory effort normal. Cardiovascular system: S1 & S2 heard, RRR. No JVD, murmurs, rubs, gallops or clicks. No pedal edema. Gastrointestinal system: Abdomen is nondistended, soft and + tender to palpation right-sided abdomen, bowel sounds present Central nervous system: Alert and oriented. No focal neurological deficits. Extremities: Symmetric 5 x 5 power. Skin: No rashes, lesions or ulcers Psychiatry: Judgement and insight appear normal. Mood & affect appropriate.    Data Reviewed: I have personally reviewed following labs and imaging studies  CBC: Recent Labs  Lab 06/21/19 1322 06/22/19 0622 06/24/19 0537 06/25/19 0510  WBC 8.4 7.7 7.6 9.1  HGB 12.6 11.5* 12.1 9.8*  HCT 39.1 36.6 37.6 30.9*  MCV 93.5 93.4 94.5 95.4  PLT 236 240 253 350   Basic Metabolic Panel: Recent Labs  Lab 06/21/19 1322 06/22/19 0622 06/24/19 0537 06/25/19 0510  NA 137 137 139 136  K 3.7 3.5 3.4* 4.7  CL 103 104 104 106  CO2 26 25 24 24   GLUCOSE 157* 123* 114* 157*  BUN 14 12 14 14   CREATININE 0.71  0.75 0.70 0.81  CALCIUM 9.1 8.4* 8.7* 8.2*  MG  --   --   --  2.2   GFR: CrCl cannot be calculated (Unknown ideal weight.). Liver Function Tests: Recent Labs  Lab 06/21/19 1322 06/22/19 0622 06/24/19 0537 06/25/19 0510  AST 25 19 20  80*  ALT 17 15 15  32  ALKPHOS 66 61 68 49  BILITOT 0.7 1.1 1.1 0.7  PROT 8.2* 6.9 7.4 5.9*  ALBUMIN 4.0 3.5 3.5 2.7*   Recent Labs  Lab 06/21/19 1322  LIPASE 34   No results for input(s): AMMONIA in the last 168 hours. Coagulation Profile: Recent Labs  Lab 06/22/19 0622  INR 1.1   Cardiac Enzymes: No results for input(s): CKTOTAL, CKMB, CKMBINDEX, TROPONINI in the last 168 hours. BNP (last 3 results) No results for input(s): PROBNP in the last 8760 hours. HbA1C: No results for input(s): HGBA1C in the last 72 hours.  CBG: No results for input(s): GLUCAP in the last 168 hours. Lipid Profile: No results for input(s): CHOL, HDL, LDLCALC, TRIG, CHOLHDL, LDLDIRECT in the last 72 hours. Thyroid Function Tests: No results for input(s): TSH, T4TOTAL, FREET4, T3FREE, THYROIDAB in the last 72 hours. Anemia Panel: No results for input(s): VITAMINB12, FOLATE, FERRITIN, TIBC, IRON, RETICCTPCT in the last 72 hours. Sepsis Labs: No results for input(s): PROCALCITON, LATICACIDVEN in the last 168 hours.  Recent Results (from the past 240 hour(s))  SARS Coronavirus 2 [facility] order, Performed in [facility] hospital lab) Nasopharyngeal Nasopharyngeal Swab     Status: None   Collection Time: 06/21/19 10:56 PM   Specimen: Nasopharyngeal Swab  Result Value Ref Range Status   SARS Coronavirus 2 NEGATIVE NEGATIVE Final    Comment: (NOTE) If result is NEGATIVE SARS-CoV-2 target nucleic acids are NOT DETECTED. The SARS-CoV-2 RNA is generally detectable in upper and lower  respiratory specimens during the acute phase of infection. The lowest  concentration of SARS-CoV-2 viral copies this assay can detect is 250  copies / mL. A negative result does not  preclude SARS-CoV-2 infection  and should not be used as the sole basis for treatment or other  patient management decisions.  A negative result may occur with  improper specimen collection / handling, submission of specimen other  than nasopharyngeal swab, presence of viral mutation(s) within the  areas targeted by this assay, and inadequate number of viral copies  (<250 copies / mL). A negative result must be combined with clinical  observations, patient history, and epidemiological information. If result is POSITIVE SARS-CoV-2 target nucleic acids are DETECTED. The SARS-CoV-2 RNA is generally detectable in upper and lower  respiratory specimens dur ing the acute phase of infection.  Positive  results are indicative of active infection with SARS-CoV-2.  Clinical  correlation with patient history and other diagnostic information is  necessary to determine patient infection status.  Positive results do  not rule out bacterial infection or co-infection with other viruses. If result is PRESUMPTIVE POSTIVE SARS-CoV-2 nucleic acids MAY BE PRESENT.   A presumptive positive result was obtained on the submitted specimen  and confirmed on repeat testing.  While 2019 novel coronavirus  (SARS-CoV-2) nucleic acids may be present in the submitted sample  additional confirmatory testing may be necessary for epidemiological  and / or clinical management purposes  to differentiate between  SARS-CoV-2 and other Sarbecovirus currently known to infect humans.  If clinically indicated additional testing with an alternate test  methodology [phone]) is advised. The SARS-CoV-2 RNA is generally  detectable in upper and lower respiratory sp ecimens during the acute  phase of infection. The expected result is Negative. Fact Sheet for Patients:  StrictlyIdeas.no Fact Sheet for Healthcare Providers: BankingDealers.co.za This test is not yet approved or cleared by  the [place] FDA and has been authorized for detection and/or diagnosis of SARS-CoV-2 by FDA under an Emergency Use Authorization (EUA).  This EUA will remain in effect (meaning this test can be used) for the duration of the COVID-19 declaration under Section 564(b)(1) of the Act, 21 U.S.C. section 360bbb-3(b)(1), unless the authorization is terminated or revoked sooner. Performed at [facility], [place] [address], [place],  [address]   Surgical pcr screen     Status: Abnormal   Collection Time: 06/24/19  4:00 AM   Specimen: Nasal Mucosa; Nasal Swab  Result Value Ref Range Status   MRSA, PCR POSITIVE (A) NEGATIVE Final    Comment: RESULT  CALLED TO, READ BACK BY AND VERIFIED WITH: BETHEL AT 0913 ON 06/24/2019 BY JPM    Staphylococcus aureus POSITIVE (A) NEGATIVE Final    Comment: (NOTE) The Xpert SA Assay (FDA approved for NASAL specimens in patients 77 years of age and older), is one component of a comprehensive surveillance program. It is not intended to diagnose infection nor to guide or monitor treatment. Performed at [facility], [place] [address], [place], [place] [address]       Radiology Studies: No results found.    Scheduled Meds: . acetaminophen  1,000 mg Oral TID  . acetaminophen  650 mg Oral Once  . Chlorhexidine Gluconate Cloth  6 each Topical Q0600  . Chlorhexidine Gluconate Cloth  6 each Topical Once  . enoxaparin (LOVENOX) injection  40 mg Subcutaneous Q24H  . gabapentin  300 mg Oral QHS  . lip balm  1 application Topical BID  . multivitamin with minerals  1 tablet Oral Daily  . mupirocin ointment  1 application Nasal BID  . pantoprazole  40 mg Oral Daily  . polyethylene glycol  17 g Oral Daily   Continuous Infusions: . 0.9 % NaCl with KCl 20 mEq / L 75 mL/hr at 06/25/19 0808  . albumin human    . piperacillin-tazobactam (ZOSYN)  IV 3.375 g (06/25/19 0601)     LOS: 4 days      Time spent: 25 minutes    Dessa Phi, DO [facility] [email or web address] 06/25/2019, 11:36 AM

## 2019-06-26 ENCOUNTER — Encounter: Payer: Self-pay | Admitting: General Practice

## 2019-06-26 DIAGNOSIS — N6320 Unspecified lump in the left breast, unspecified quadrant: Secondary | ICD-10-CM

## 2019-06-26 DIAGNOSIS — C801 Malignant (primary) neoplasm, unspecified: Secondary | ICD-10-CM

## 2019-06-26 LAB — COMPREHENSIVE METABOLIC PANEL
ALT: 26 U/L (ref 0–44)
AST: 45 U/L — ABNORMAL HIGH (ref 15–41)
Albumin: 2.9 g/dL — ABNORMAL LOW (ref 3.5–5.0)
Alkaline Phosphatase: 48 U/L (ref 38–126)
Anion gap: 6 (ref 5–15)
BUN: 10 mg/dL (ref 8–23)
CO2: 26 mmol/L (ref 22–32)
Calcium: 8.1 mg/dL — ABNORMAL LOW (ref 8.9–10.3)
Chloride: 105 mmol/L (ref 98–111)
Creatinine, Ser: 0.86 mg/dL (ref 0.44–1.00)
GFR calc Af Amer: >60 mL/min (ref >60)
GFR calc non Af Amer: >60 mL/min (ref >60)
Glucose, Bld: 118 mg/dL — ABNORMAL HIGH (ref 70–99)
Potassium: 3.6 mmol/L (ref 3.5–5.1)
Sodium: 137 mmol/L (ref 135–145)
Total Bilirubin: 0.5 mg/dL (ref 0.3–1.2)
Total Protein: 6 g/dL — ABNORMAL LOW (ref 6.5–8.1)

## 2019-06-26 LAB — CBC
HCT: 27.7 % — ABNORMAL LOW (ref 36.0–46.0)
Hemoglobin: 8.9 g/dL — ABNORMAL LOW (ref 12.0–15.0)
MCH: 30.5 pg (ref 26.0–34.0)
MCHC: 32.1 g/dL (ref 30.0–36.0)
MCV: 94.9 fL (ref 80.0–100.0)
Platelets: 204 10*3/uL (ref 150–400)
RBC: 2.92 MIL/uL — ABNORMAL LOW (ref 3.87–5.11)
RDW: 12.9 % (ref 11.5–15.5)
WBC: 9.1 10*3/uL (ref 4.0–10.5)
nRBC: 0 % (ref 0.0–0.2)

## 2019-06-26 LAB — LACTIC ACID, PLASMA
Lactic Acid, Venous: 1.3 mmol/L (ref 0.5–1.9)
Lactic Acid, Venous: 1.3 mmol/L (ref 0.5–1.9)

## 2019-06-26 MED ORDER — SODIUM CHLORIDE 0.9 % IV BOLUS
250.0000 mL | Freq: Once | INTRAVENOUS | Status: AC
  Administered 2019-06-26: 250 mL via INTRAVENOUS

## 2019-06-26 NOTE — Evaluation (Signed)
Physical Therapy Evaluation Patient Details Name: JAELEIGH MONACO MRN: [id number] DOB: 07/04/1933 Today's Date: 06/26/2019   History of Present Illness  85 year old Mandarin speaking female with no significant past medical history presents with epigastric pain. CT abdomen pelvis revealed distended gallbladder with gallbladder wall thickening raising concern for acute cholecystitis, multiple masses in the left breast tissue, mass adjacent to pancreatic body and head.  Pt underwent cholecystectomy, excisional biopsy lymph node, Dr. Johney Maine 06/24/19.  Clinical Impression  Pt admitted with above diagnosis. Pt currently with functional limitations due to the deficits listed below (see PT Problem List).  Pt will benefit from skilled PT to increase their independence and safety with mobility to allow discharge to the venue listed below.   Pt seen with daughter who assisted with interpreting.  Daughter reports pt has not ambulated in 6 days.  Pt required assist for bed mobility due to pain however able to ambulate in hallway with min/guard assist for safety.  Pt encouraged to ambulate at least 3x/day with nursing staff.  Daughter agreeable for HHPT (if possible).  Pt to d/c to her son's home per daughter present.     Follow Up Recommendations Home health PT;Supervision for mobility/OOB    Equipment Recommendations  Rolling walker with 5" wheels    Recommendations for Other Services       Precautions / Restrictions Precautions Precautions: Fall Precaution Comments: R JP drain Restrictions Weight Bearing Restrictions: No      Mobility  Bed Mobility Overal bed mobility: Needs Assistance Bed Mobility: Rolling;Sidelying to Sit Rolling: Min assist Sidelying to sit: Min assist       General bed mobility comments: assisted with log roll technique for pain control  Transfers Overall transfer level: Needs assistance Equipment used: Rolling walker (2 wheeled) Transfers: Sit to/from Stand Sit to Stand:  Min guard         General transfer comment: verbal cues for safe technique  Ambulation/Gait Ambulation/Gait assistance: Min guard Gait Distance (Feet): 160 Feet Assistive device: Rolling walker (2 wheeled) Gait Pattern/deviations: Step-through pattern;Decreased stride length;Trunk flexed Gait velocity: decr   General Gait Details: verbal cues for posture however pain limiting, cues also for RW positioning, distance to tolerance  Stairs            Wheelchair Mobility    Modified Rankin (Stroke Patients Only)       Balance Overall balance assessment: Needs assistance         Standing balance support: Bilateral upper extremity supported Standing balance-Leahy Scale: Poor Standing balance comment: reliant on UE support                             Pertinent Vitals/Pain Pain Assessment: Faces Faces Pain Scale: Hurts even more Pain Location: abdomen Pain Descriptors / Indicators: Grimacing;Guarding;Moaning;Discomfort Pain Intervention(s): Repositioned;Monitored during session    Home Living Family/patient expects to be discharged to:: Private residence Living Arrangements: Alone Available Help at Discharge: Family Type of Home: House Home Access: Stairs to enter   Technical brewer of Steps: 2 Home Layout: Able to live on main level with bedroom/bathroom Home Equipment: Walker - standard Additional Comments: daughter present and reports pt will d/c to pt's son home as above    Prior Function Level of Independence: Independent               Hand Dominance        Extremity/Trunk Assessment        Lower Extremity  Assessment Lower Extremity Assessment: Generalized weakness       Communication   Communication: Prefers language other than English(Mandarian, daughter interpreted)  Cognition Arousal/Alertness: Awake/alert Behavior During Therapy: WFL for tasks assessed/performed Overall Cognitive Status: Within Functional Limits  for tasks assessed                                        General Comments      Exercises     Assessment/Plan    PT Assessment Patient needs continued PT services  PT Problem List Decreased strength;Decreased activity tolerance;Decreased balance;Decreased knowledge of use of DME;Decreased mobility;Pain       PT Treatment Interventions DME instruction;Therapeutic exercise;Gait training;Balance training;Stair training;Functional mobility training;Therapeutic activities;Patient/family education    PT Goals (Current goals can be found in the Care Plan section)  Acute Rehab PT Goals PT Goal Formulation: With patient Time For Goal Achievement: 07/10/19 Potential to Achieve Goals: Good    Frequency Min 3X/week   Barriers to discharge        Co-evaluation               AM-PAC PT "6 Clicks" Mobility  Outcome Measure Help needed turning from your back to your side while in a flat bed without using bedrails?: A Lot Help needed moving from lying on your back to sitting on the side of a flat bed without using bedrails?: A Lot Help needed moving to and from a bed to a chair (including a wheelchair)?: A Little Help needed standing up from a chair using your arms (e.g., wheelchair or bedside chair)?: A Little Help needed to walk in hospital room?: A Little Help needed climbing 3-5 steps with a railing? : A Lot 6 Click Score: 15    End of Session Equipment Utilized During Treatment: Gait belt Activity Tolerance: Patient tolerated treatment well Patient left: in chair;with chair alarm set;with family/visitor present;with call bell/phone within reach   PT Visit Diagnosis: Other abnormalities of gait and mobility (R26.89)    Time: 0865-7846 PT Time Calculation (min) (ACUTE ONLY): 29 min   Charges:   PT Evaluation $PT Eval Low Complexity: 1 Low PT Treatments $Gait Training: 8-22 mins   Carmelia Bake, PT, DPT Acute Rehabilitation Services Office:  [phone] Pager: [place] 06/26/2019, 12:57 PM

## 2019-06-26 NOTE — Progress Notes (Signed)
PROGRESS NOTE    Rachel Hughes  ZHY:[id number] DOB: 08-May-1933 DOA: 06/21/2019 PCP: Vivi Barrack, MD     Brief Narrative:  Rachel Hughes is a 85 year old Mandarin speaking female with no significant past medical history presents with epigastric pain  for 5 days.  She has not seen a physician since 2018.  She has never had a mammogram to her knowledge, if she had it was over 20 years ago likely. She states that she has had some intermittent abdominal pain in the past, always resolved at home but this time it has persisted which prompted her to seek care in the hospital.  Had no other complaints on admission.  In the emergency department, CT abdomen pelvis revealed distended gallbladder with gallbladder wall thickening raising concern for acute cholecystitis, multiple masses in the left breast tissue, mass adjacent to pancreatic body and head.  General surgery was consulted as well as GI and oncology.   Subjective: Spoke with the patient's daughter at bedside who also translates for the patient.  Patient complains of mild abdominal pain at the site of surgery.  She complains of gaseous distention and generalized weakness with difficulty ambulation.  Has not had bowel movement or passed flatus today.  Assessment & Plan:   Principal Problem:   Abdominal pain Active Problems:   Gallbladder sludge   Pancreatic mass   Mass of breast, left   Right upper quadrant abdominal pain, chronic cholecystitis RUQ [place]: Large amount of sludge within gallbladder with borderline gallbladder wall thickness, no visible stones.  Patient was seen by general surgery and underwent cholecystectomy, excisional biopsy lymph node, Dr. gross 8/3. Advance diet as tolerated, per general surgery.  Patient will need to ambulate.  Patient does have a drain , management as per surgery.  PT evaluation pending  New Left breast mass x2 likely breast cancer, peri-pancreatic lymphadenopathy  -CT abdomen pelvis revealed distended  gallbladder with gallbladder wall thickening.  Multiple masses in the left breast tissue.  3.2 cm and 2.5 cm masses adjacent to the pancreatic body and head. CT chest revealed 2 adjacent rounded soft tissue masses in the left breast suspicious for breast cancer, no CT findings of metastatic disease involving chest/lung, no lytic or sclerotic bone lesion to suggest osseous metastatic disease -GI and IR reviewed the patient's imaging, patient's pancreas appeared normal.  IR recommended outpatient mammogram, ultrasound guided biopsy of left breast mass.  Patient has been seen by Dr. Burr Medico oncology as well. Dr. Paulita Fujita, GI, reviewed case and does not feel EUS indicated.  At this time will follow excisional lymph node biopsy report.  DVT prophylaxis: Lovenox   Code Status: Full  Family Communication: Daughter at bedside,  Disposition Plan: Pending clinical improvement.  Ambulate the patient.  PT evaluation, pending JP drain with a still high output.  Pathology pending.   Consultants:   General surgery  GI  Oncology  Procedures:   None  Antimicrobials:  Anti-infectives (From admission, onward)   Start     Dose/Rate Route Frequency Ordered Stop   06/25/19 0600  ceFAZolin (ANCEF) IVPB 2g/100 mL premix  Status:  Discontinued     2 g 200 mL/hr over 30 Minutes Intravenous On call to O.R. 06/24/19 1324 06/24/19 1402   06/25/19 0600  metroNIDAZOLE (FLAGYL) IVPB 500 mg  Status:  Discontinued     500 mg 100 mL/hr over 60 Minutes Intravenous On call to O.R. 06/24/19 1324 06/24/19 1402   06/24/19 2200  piperacillin-tazobactam (ZOSYN) IVPB 3.375 g  3.375 g 12.5 mL/hr over 240 Minutes Intravenous Every 8 hours 06/24/19 2004 06/26/19 0927   06/24/19 1415  ceFAZolin (ANCEF) IVPB 2g/100 mL premix     2 g 200 mL/hr over 30 Minutes Intravenous On call to O.R. 06/24/19 1402 06/24/19 1507   06/24/19 1415  metroNIDAZOLE (FLAGYL) IVPB 500 mg     500 mg 100 mL/hr over 60 Minutes Intravenous On call to  O.R. 06/24/19 1402 06/24/19 1528   06/22/19 0600  piperacillin-tazobactam (ZOSYN) IVPB 3.375 g  Status:  Discontinued     3.375 g 12.5 mL/hr over 240 Minutes Intravenous Every 8 hours 06/22/19 0527 06/22/19 1211   06/21/19 2315  piperacillin-tazobactam (ZOSYN) IVPB 3.375 g     3.375 g 100 mL/hr over 30 Minutes Intravenous  Once 06/21/19 2306 06/22/19 0014       Objective: Vitals:   06/26/19 0500 06/26/19 0505 06/26/19 0508 06/26/19 0629  BP:  (!) 83/61 (!) 86/60 93/61  Pulse:  73  70  Resp:  16  15  Temp:  97.8 F (36.6 C)  98.5 F (36.9 C)  TempSrc:    Oral  SpO2:  98%  96%  Weight: 63.6 kg       Intake/Output Summary (Last 24 hours) at 06/26/2019 1046 Last data filed at 06/26/2019 0919 Gross per 24 hour  Intake 350.08 ml  Output 275 ml  Net 75.08 ml   Filed Weights   06/24/19 0948 06/25/19 0500 06/26/19 0500  Weight: 63.2 kg 63.3 kg 63.6 kg    Physical examination:  General:  Average built, not in obvious distress HENT: Normocephalic, pupils equally reacting to light and accommodation.  No scleral pallor or icterus noted. Oral mucosa is moist.  Chest:  Clear breath sounds.  Diminished breath sounds bilaterally. No crackles or wheezes.  CVS: S1 &S2 heard. No murmur.  Regular rate and rhythm. Abdomen: Soft and nondistended.  Positive bowel sounds.  Incision clean dry and intact with tenderness to palpation on the right epigastric region.  JP drain in place. Extremities: No cyanosis, clubbing or edema.  Peripheral pulses are palpable. Psych: Alert, awake and oriented, normal mood CNS:  No cranial nerve deficits.  Power equal in all extremities.  No sensory deficits noted.  No cerebellar signs.   Skin: Warm and dry.  No rashes noted.   Data Reviewed: I have personally reviewed following labs and imaging studies  CBC: Recent Labs  Lab 06/22/19 0622 06/24/19 0537 06/25/19 0510 06/25/19 1346 06/26/19 0522  WBC 7.7 7.6 9.1 12.3* 9.1  HGB 11.5* 12.1 9.8* 9.6* 8.9*   HCT 36.6 37.6 30.9* 29.9* 27.7*  MCV 93.4 94.5 95.4 93.7 94.9  PLT 240 253 241 241 563   Basic Metabolic Panel: Recent Labs  Lab 06/21/19 1322 06/22/19 0622 06/24/19 0537 06/25/19 0510 06/26/19 0522  NA 137 137 139 136 137  K 3.7 3.5 3.4* 4.7 3.6  CL 103 104 104 106 105  CO2 26 25 24 24 26   GLUCOSE 157* 123* 114* 157* 118*  BUN 14 12 14 14 10   CREATININE 0.71 0.75 0.70 0.81 0.86  CALCIUM 9.1 8.4* 8.7* 8.2* 8.1*  MG  --   --   --  2.2  --    GFR: CrCl cannot be calculated (Unknown ideal weight.). Liver Function Tests: Recent Labs  Lab 06/21/19 1322 06/22/19 0622 06/24/19 0537 06/25/19 0510 06/26/19 0522  AST 25 19 20  80* 45*  ALT 17 15 15  32 26  ALKPHOS 66 61 68 49  48  BILITOT 0.7 1.1 1.1 0.7 0.5  PROT 8.2* 6.9 7.4 5.9* 6.0*  ALBUMIN 4.0 3.5 3.5 2.7* 2.9*   Recent Labs  Lab 06/21/19 1322  LIPASE 34   No results for input(s): AMMONIA in the last 168 hours. Coagulation Profile: Recent Labs  Lab 06/22/19 0622  INR 1.1   Cardiac Enzymes: No results for input(s): CKTOTAL, CKMB, CKMBINDEX, TROPONINI in the last 168 hours. BNP (last 3 results) No results for input(s): PROBNP in the last 8760 hours. HbA1C: No results for input(s): HGBA1C in the last 72 hours. CBG: No results for input(s): GLUCAP in the last 168 hours. Lipid Profile: No results for input(s): CHOL, HDL, LDLCALC, TRIG, CHOLHDL, LDLDIRECT in the last 72 hours. Thyroid Function Tests: No results for input(s): TSH, T4TOTAL, FREET4, T3FREE, THYROIDAB in the last 72 hours. Anemia Panel: No results for input(s): VITAMINB12, FOLATE, FERRITIN, TIBC, IRON, RETICCTPCT in the last 72 hours. Sepsis Labs: Recent Labs  Lab 06/26/19 0522 06/26/19 0827  LATICACIDVEN 1.3 1.3    Recent Results (from the past 240 hour(s))  SARS Coronavirus 2 [facility] order, Performed in [facility] hospital lab) Nasopharyngeal Nasopharyngeal Swab     Status: None   Collection Time: 06/21/19 10:56 PM   Specimen:  Nasopharyngeal Swab  Result Value Ref Range Status   SARS Coronavirus 2 NEGATIVE NEGATIVE Final    Comment: (NOTE) If result is NEGATIVE SARS-CoV-2 target nucleic acids are NOT DETECTED. The SARS-CoV-2 RNA is generally detectable in upper and lower  respiratory specimens during the acute phase of infection. The lowest  concentration of SARS-CoV-2 viral copies this assay can detect is 250  copies / mL. A negative result does not preclude SARS-CoV-2 infection  and should not be used as the sole basis for treatment or other  patient management decisions.  A negative result may occur with  improper specimen collection / handling, submission of specimen other  than nasopharyngeal swab, presence of viral mutation(s) within the  areas targeted by this assay, and inadequate number of viral copies  (<250 copies / mL). A negative result must be combined with clinical  observations, patient history, and epidemiological information. If result is POSITIVE SARS-CoV-2 target nucleic acids are DETECTED. The SARS-CoV-2 RNA is generally detectable in upper and lower  respiratory specimens dur ing the acute phase of infection.  Positive  results are indicative of active infection with SARS-CoV-2.  Clinical  correlation with patient history and other diagnostic information is  necessary to determine patient infection status.  Positive results do  not rule out bacterial infection or co-infection with other viruses. If result is PRESUMPTIVE POSTIVE SARS-CoV-2 nucleic acids MAY BE PRESENT.   A presumptive positive result was obtained on the submitted specimen  and confirmed on repeat testing.  While 2019 novel coronavirus  (SARS-CoV-2) nucleic acids may be present in the submitted sample  additional confirmatory testing may be necessary for epidemiological  and / or clinical management purposes  to differentiate between  SARS-CoV-2 and other Sarbecovirus currently known to infect humans.  If clinically  indicated additional testing with an alternate test  methodology [phone]) is advised. The SARS-CoV-2 RNA is generally  detectable in upper and lower respiratory sp ecimens during the acute  phase of infection. The expected result is Negative. Fact Sheet for Patients:  StrictlyIdeas.no Fact Sheet for Healthcare Providers: BankingDealers.co.za This test is not yet approved or cleared by the [place] FDA and has been authorized for detection and/or diagnosis of SARS-CoV-2 by FDA under  an Emergency Use Authorization (EUA).  This EUA will remain in effect (meaning this test can be used) for the duration of the COVID-19 declaration under Section 564(b)(1) of the Act, 21 U.S.C. section 360bbb-3(b)(1), unless the authorization is terminated or revoked sooner. Performed at [facility], [place] [address], [place], [place] [address]   Surgical pcr screen     Status: Abnormal   Collection Time: 06/24/19  4:00 AM   Specimen: Nasal Mucosa; Nasal Swab  Result Value Ref Range Status   MRSA, PCR POSITIVE (A) NEGATIVE Final    Comment: RESULT CALLED TO, READ BACK BY AND VERIFIED WITH: BETHEL AT 0913 ON 06/24/2019 BY JPM    Staphylococcus aureus POSITIVE (A) NEGATIVE Final    Comment: (NOTE) The Xpert SA Assay (FDA approved for NASAL specimens in patients 109 years of age and older), is one component of a comprehensive surveillance program. It is not intended to diagnose infection nor to guide or monitor treatment. Performed at [facility], [place] [address], [place], [place] [address]       Radiology Studies: No results found.    Scheduled Meds: . acetaminophen  1,000 mg Oral TID  . Chlorhexidine Gluconate Cloth  6 each Topical Q0600  . Chlorhexidine Gluconate Cloth  6 each Topical Once  . enoxaparin (LOVENOX) injection  40 mg Subcutaneous Q24H  . gabapentin  300 mg Oral QHS  . lip balm  1 application  Topical BID  . multivitamin with minerals  1 tablet Oral Daily  . mupirocin ointment  1 application Nasal BID  . pantoprazole  40 mg Oral Daily  . polyethylene glycol  17 g Oral Daily   Continuous Infusions: . albumin human       LOS: 5 days    Flora Lipps, MD [facility] [email or web address] 06/26/2019, 10:46 AM

## 2019-06-26 NOTE — Progress Notes (Signed)
[place] CSW Progress Notes  Application for [employer] to daughter, spoke w daughter and RN CM Suanne Marker.  They will print application, family will complete their part, oncologist will complete her part of application.  Agency may be able to help w small amount of unpaid medical bills needed for patient's breast cancer care.  Edwyna Shell, LCSW Clinical Social Worker Phone:  [phone] Cell:  [phone]

## 2019-06-26 NOTE — Anesthesia Postprocedure Evaluation (Signed)
Anesthesia Post Note  Patient: Star Resler Jake  Procedure(s) Performed: LAPAROSCOPIC CHOLECYSTECTOMY WITH INTRAOPERATIVE CHOLANGIOGRAM WITH LYMPH NODE BIOPSY (N/A )     Patient location during evaluation: PACU Anesthesia Type: General Level of consciousness: awake and alert Pain management: pain level controlled Vital Signs Assessment: post-procedure vital signs reviewed and stable Respiratory status: spontaneous breathing, nonlabored ventilation, respiratory function stable and patient connected to nasal cannula oxygen Cardiovascular status: blood pressure returned to baseline and stable Postop Assessment: no apparent nausea or vomiting Anesthetic complications: no    Last Vitals:  Vitals:   06/26/19 0508 06/26/19 0629  BP: (!) 86/60 93/61  Pulse:  70  Resp:  15  Temp:  36.9 C  SpO2:  96%    Last Pain:  Vitals:   06/26/19 0629  TempSrc: Oral  PainSc:                  Cletus Paris S

## 2019-06-26 NOTE — Progress Notes (Signed)
Sesser   DOB:1933-09-05   HU#:[id number]   ZCH#:[id number]  Oncology follow up   Subjective: Patient underwent cholecystectomy and portal lymph node biopsy by Dr. gross two days ago.  She still has significant pain at the surgical sites, but was able to ambulate in the hallway, and ate something today. She is passing gas, no BM yet since surgery.    Objective:  Vitals:   06/26/19 0629 06/26/19 1346  BP: 93/61 (!) 99/58  Pulse: 70 88  Resp: 15 16  Temp: 98.5 F (36.9 C) 98.3 F (36.8 C)  SpO2: 96% 96%    Body mass index is 27.38 kg/m.  Intake/Output Summary (Last 24 hours) at 06/26/2019 2105 Last data filed at 06/26/2019 1815 Gross per 24 hour  Intake 590 ml  Output 150 ml  Net 440 ml     Sclerae unicteric  Oropharynx clear  No peripheral adenopathy  Lungs clear -- no rales or rhonchi  Heart regular rate and rhythm  Abdomen soft, laparoscopic incision are healing well, (+) draining tube in RUQ, (+) tenderness in ride side abdomen  MSK no focal spinal tenderness, no peripheral edema  Neuro nonfocal   CBG (last 3)  No results for input(s): GLUCAP in the last 72 hours.   Labs:  Lab Results  Component Value Date   WBC 9.1 06/26/2019   HGB 8.9 (L) 06/26/2019   HCT 27.7 (L) 06/26/2019   MCV 94.9 06/26/2019   PLT 204 06/26/2019    Urine Studies No results for input(s): UHGB, CRYS in the last 72 hours.  Invalid input(s): UACOL, UAPR, USPG, UPH, UTP, UGL, UKET, UBIL, UNIT, UROB, [place], UEPI, UWBC, Duwayne Heck [place], [place]  Basic Metabolic Panel: Recent Labs  Lab 06/21/19 1322 06/22/19 0622 06/24/19 0537 06/25/19 0510 06/26/19 0522  NA 137 137 139 136 137  K 3.7 3.5 3.4* 4.7 3.6  CL 103 104 104 106 105  CO2 26 25 24 24 26   GLUCOSE 157* 123* 114* 157* 118*  BUN 14 12 14 14 10   CREATININE 0.71 0.75 0.70 0.81 0.86  CALCIUM 9.1 8.4* 8.7* 8.2* 8.1*  MG  --   --   --  2.2  --    GFR CrCl cannot be calculated (Unknown ideal weight.). Liver Function  Tests: Recent Labs  Lab 06/21/19 1322 06/22/19 0622 06/24/19 0537 06/25/19 0510 06/26/19 0522  AST 25 19 20  80* 45*  ALT 17 15 15  32 26  ALKPHOS 66 61 68 49 48  BILITOT 0.7 1.1 1.1 0.7 0.5  PROT 8.2* 6.9 7.4 5.9* 6.0*  ALBUMIN 4.0 3.5 3.5 2.7* 2.9*   Recent Labs  Lab 06/21/19 1322  LIPASE 34   No results for input(s): AMMONIA in the last 168 hours. Coagulation profile Recent Labs  Lab 06/22/19 0622  INR 1.1    CBC: Recent Labs  Lab 06/22/19 0622 06/24/19 0537 06/25/19 0510 06/25/19 1346 06/26/19 0522  WBC 7.7 7.6 9.1 12.3* 9.1  HGB 11.5* 12.1 9.8* 9.6* 8.9*  HCT 36.6 37.6 30.9* 29.9* 27.7*  MCV 93.4 94.5 95.4 93.7 94.9  PLT 240 253 241 241 204   Cardiac Enzymes: No results for input(s): CKTOTAL, CKMB, CKMBINDEX, TROPONINI in the last 168 hours. BNP: Invalid input(s): POCBNP CBG: No results for input(s): GLUCAP in the last 168 hours. D-Dimer No results for input(s): DDIMER in the last 72 hours. Hgb A1c No results for input(s): HGBA1C in the last 72 hours. Lipid Profile No results for input(s): CHOL, HDL,  LDLCALC, TRIG, CHOLHDL, LDLDIRECT in the last 72 hours. Thyroid function studies No results for input(s): TSH, T4TOTAL, T3FREE, THYROIDAB in the last 72 hours.  Invalid input(s): FREET3 Anemia work up No results for input(s): VITAMINB12, FOLATE, FERRITIN, TIBC, IRON, RETICCTPCT in the last 72 hours. Microbiology Recent Results (from the past 240 hour(s))  SARS Coronavirus 2 [facility] order, Performed in [facility] hospital lab) Nasopharyngeal Nasopharyngeal Swab     Status: None   Collection Time: 06/21/19 10:56 PM   Specimen: Nasopharyngeal Swab  Result Value Ref Range Status   SARS Coronavirus 2 NEGATIVE NEGATIVE Final    Comment: (NOTE) If result is NEGATIVE SARS-CoV-2 target nucleic acids are NOT DETECTED. The SARS-CoV-2 RNA is generally detectable in upper and lower  respiratory specimens during the acute phase of infection. The lowest   concentration of SARS-CoV-2 viral copies this assay can detect is 250  copies / mL. A negative result does not preclude SARS-CoV-2 infection  and should not be used as the sole basis for treatment or other  patient management decisions.  A negative result may occur with  improper specimen collection / handling, submission of specimen other  than nasopharyngeal swab, presence of viral mutation(s) within the  areas targeted by this assay, and inadequate number of viral copies  (<250 copies / mL). A negative result must be combined with clinical  observations, patient history, and epidemiological information. If result is POSITIVE SARS-CoV-2 target nucleic acids are DETECTED. The SARS-CoV-2 RNA is generally detectable in upper and lower  respiratory specimens dur ing the acute phase of infection.  Positive  results are indicative of active infection with SARS-CoV-2.  Clinical  correlation with patient history and other diagnostic information is  necessary to determine patient infection status.  Positive results do  not rule out bacterial infection or co-infection with other viruses. If result is PRESUMPTIVE POSTIVE SARS-CoV-2 nucleic acids MAY BE PRESENT.   A presumptive positive result was obtained on the submitted specimen  and confirmed on repeat testing.  While 2019 novel coronavirus  (SARS-CoV-2) nucleic acids may be present in the submitted sample  additional confirmatory testing may be necessary for epidemiological  and / or clinical management purposes  to differentiate between  SARS-CoV-2 and other Sarbecovirus currently known to infect humans.  If clinically indicated additional testing with an alternate test  methodology [phone]) is advised. The SARS-CoV-2 RNA is generally  detectable in upper and lower respiratory sp ecimens during the acute  phase of infection. The expected result is Negative. Fact Sheet for Patients:  StrictlyIdeas.no Fact Sheet  for Healthcare Providers: BankingDealers.co.za This test is not yet approved or cleared by the [place] FDA and has been authorized for detection and/or diagnosis of SARS-CoV-2 by FDA under an Emergency Use Authorization (EUA).  This EUA will remain in effect (meaning this test can be used) for the duration of the COVID-19 declaration under Section 564(b)(1) of the Act, 21 U.S.C. section 360bbb-3(b)(1), unless the authorization is terminated or revoked sooner. Performed at [facility], [place] [address], [place], [place] [address]   Surgical pcr screen     Status: Abnormal   Collection Time: 06/24/19  4:00 AM   Specimen: Nasal Mucosa; Nasal Swab  Result Value Ref Range Status   MRSA, PCR POSITIVE (A) NEGATIVE Final    Comment: RESULT CALLED TO, READ BACK BY AND VERIFIED WITH: BETHEL AT 0913 ON 06/24/2019 BY JPM    Staphylococcus aureus POSITIVE (A) NEGATIVE Final    Comment: (  NOTE) The Xpert SA Assay (FDA approved for NASAL specimens in patients 73 years of age and older), is one component of a comprehensive surveillance program. It is not intended to diagnose infection nor to guide or monitor treatment. Performed at [facility], [place] [address] [place], [place] [address]       Studies:  No results found.  Assessment: 85 y.o. [place] woman, without significant past medical history, presented with worsening epigastric pain for 5 days, along slightly decreased appetite, fatigue, and 7 pound weight loss in the past few months and left breast masses   1. Left breast masses (2), likely breast cancer 2.  probably gall bladder cancer with node metastasis  3. abdominal pain second to #2 and surgery    Plan:  -I spoke with pathologist today, her gall bladder and node biopsy were all positive for poorly differentiated carcinoma, favor adeno, IHC studies are pending, will have final result tomorrow, to see if this is  primary gladder tumor vs metastatic disease (breast markers ordered).  -I plan to discuss with pt and her daughter tomorrow when I have the final results. I would likely talk to her daughter first, to see how much she would like her mother to know. The prognosis of gall bladder cancer is certainly much worse than breast cancer, and I am not sure if pt is willing or if she is a candidate for more extensive oncological surgery.  -I encouraged her to take pain meds as needed and participate PT. She really wants to go home when she is ready to be released.  -will see her back tomorrow.    Truitt Merle, MD 06/26/2019

## 2019-06-26 NOTE — Progress Notes (Signed)
[facility]/Trauma Progress Note  2 Days Post-Op   Assessment/Plan Pancreatic lymphadenopathy Left breast mass - oncology following  Gallbladder distension and sludge, RUQ pain - chronic cholecystitis  -S/P lap chole, LOA, excisional biopsy porta hepatis lymph nodes, Dr. Johney Maine, 08/03 - JP drain output high in last 24hrs but per nurse states it seems to be slowing. 300cc in last 24hrs - path pending - ambulate, PT pending ABLA - Hgb today 8.9 from 9.6 yesterday, monitor hgb and drain output  FEN:HH diet VTE: SCD's, lovenox DV:VOHYW 07/31-08/05   WBC 9.1 Foley:none Follow up:TBD  DISPO:pt needs to ambulate. PT pending. Monitor Hgb and drain output.    LOS: 5 days    Subjective: CC: abdominal pain  Per daughter who assisted in translation, the pt had abdominal bloating and pain last night after eating broth. This is better today and pt is having copious flatus. She is not having any abdominal pain at rest but pain of right hemiabdomen with movement. Pt is still weak and tired. She has not walked the halls since admission. I discussed deconditioning with the daughter and explained the importance of ambulation. I ordered PT. No N, V, fever or chills overnight.   Objective: Vital signs in last 24 hours: Temp:  [97.8 F (36.6 C)-99 F (37.2 C)] 98.5 F (36.9 C) (08/05 0629) Pulse Rate:  [70-78] 70 (08/05 0629) Resp:  [15-16] 15 (08/05 0629) BP: (83-127)/(60-72) 93/61 (08/05 0629) SpO2:  [94 %-98 %] 96 % (08/05 0629) Weight:  [63.6 kg] 63.6 kg (08/05 0500) Last BM Date: 07/11/19  Intake/Output from previous day: 08/04 0701 - 08/05 0700 In: 813.1 [P.O.:591; IV Piggyback:222.1] Out: 300 [Drains:300] Intake/Output this shift: Total I/O In: 10 [Other:10] Out: -   PE: Gen:  Alert, NAD, pleasant, cooperative Pulm:  Rate and effort normal Abd: Soft, ND, +BS, incisions C/D/I, drain with minimal sanguinous drainage, TTP right hemiabdomen and epigastric  port site without guarding. No peritonitis  Skin: no rashes noted, warm and dry   Anti-infectives: Anti-infectives (From admission, onward)   Start     Dose/Rate Route Frequency Ordered Stop   06/25/19 0600  ceFAZolin (ANCEF) IVPB 2g/100 mL premix  Status:  Discontinued     2 g 200 mL/hr over 30 Minutes Intravenous On call to O.R. 06/24/19 1324 06/24/19 1402   06/25/19 0600  metroNIDAZOLE (FLAGYL) IVPB 500 mg  Status:  Discontinued     500 mg 100 mL/hr over 60 Minutes Intravenous On call to O.R. 06/24/19 1324 06/24/19 1402   06/24/19 2200  piperacillin-tazobactam (ZOSYN) IVPB 3.375 g     3.375 g 12.5 mL/hr over 240 Minutes Intravenous Every 8 hours 06/24/19 2004 06/26/19 0927   06/24/19 1415  ceFAZolin (ANCEF) IVPB 2g/100 mL premix     2 g 200 mL/hr over 30 Minutes Intravenous On call to O.R. 06/24/19 1402 06/24/19 1507   06/24/19 1415  metroNIDAZOLE (FLAGYL) IVPB 500 mg     500 mg 100 mL/hr over 60 Minutes Intravenous On call to O.R. 06/24/19 1402 06/24/19 1528   06/22/19 0600  piperacillin-tazobactam (ZOSYN) IVPB 3.375 g  Status:  Discontinued     3.375 g 12.5 mL/hr over 240 Minutes Intravenous Every 8 hours 06/22/19 0527 06/22/19 1211   06/21/19 2315  piperacillin-tazobactam (ZOSYN) IVPB 3.375 g     3.375 g 100 mL/hr over 30 Minutes Intravenous  Once 06/21/19 2306 06/22/19 0014      Lab Results:  Recent Labs    06/25/19 1346 06/26/19 0522  WBC 12.3* 9.1  HGB 9.6* 8.9*  HCT 29.9* 27.7*  PLT 241 204   BMET Recent Labs    06/25/19 0510 06/26/19 0522  NA 136 137  K 4.7 3.6  CL 106 105  CO2 24 26  GLUCOSE 157* 118*  BUN 14 10  CREATININE 0.81 0.86  CALCIUM 8.2* 8.1*   PT/INR No results for input(s): LABPROT, INR in the last 72 hours. CMP     Component Value Date/Time   NA 137 06/26/2019 0522   K 3.6 06/26/2019 0522   CL 105 06/26/2019 0522   CO2 26 06/26/2019 0522   GLUCOSE 118 (H) 06/26/2019 0522   BUN 10 06/26/2019 0522   CREATININE 0.86 06/26/2019  0522   CALCIUM 8.1 (L) 06/26/2019 0522   PROT 6.0 (L) 06/26/2019 0522   ALBUMIN 2.9 (L) 06/26/2019 0522   AST 45 (H) 06/26/2019 0522   ALT 26 06/26/2019 0522   ALKPHOS 48 06/26/2019 0522   BILITOT 0.5 06/26/2019 0522   GFRNONAA >60 06/26/2019 0522   GFRAA >60 06/26/2019 0522   Lipase     Component Value Date/Time   LIPASE 34 06/21/2019 1322    Studies/Results: No results found.    Kalman Drape , [facility] Surgery 06/26/2019, 9:33 AM  Pager: [phone] Mon-Wed, Friday 7:00am-4:30pm Thurs 7am-11:30am  Consults: [phone]

## 2019-06-27 ENCOUNTER — Inpatient Hospital Stay (HOSPITAL_COMMUNITY): Payer: Self-pay

## 2019-06-27 LAB — COMPREHENSIVE METABOLIC PANEL
ALT: 25 U/L (ref 0–44)
AST: 35 U/L (ref 15–41)
Albumin: 2.9 g/dL — ABNORMAL LOW (ref 3.5–5.0)
Alkaline Phosphatase: 59 U/L (ref 38–126)
Anion gap: 7 (ref 5–15)
BUN: 9 mg/dL (ref 8–23)
CO2: 28 mmol/L (ref 22–32)
Calcium: 8.3 mg/dL — ABNORMAL LOW (ref 8.9–10.3)
Chloride: 104 mmol/L (ref 98–111)
Creatinine, Ser: 0.79 mg/dL (ref 0.44–1.00)
GFR calc Af Amer: >60 mL/min (ref >60)
GFR calc non Af Amer: >60 mL/min (ref >60)
Glucose, Bld: 134 mg/dL — ABNORMAL HIGH (ref 70–99)
Potassium: 3.4 mmol/L — ABNORMAL LOW (ref 3.5–5.1)
Sodium: 139 mmol/L (ref 135–145)
Total Bilirubin: 0.7 mg/dL (ref 0.3–1.2)
Total Protein: 6.7 g/dL (ref 6.5–8.1)

## 2019-06-27 LAB — CBC
HCT: 28.2 % — ABNORMAL LOW (ref 36.0–46.0)
Hemoglobin: 9 g/dL — ABNORMAL LOW (ref 12.0–15.0)
MCH: 30.3 pg (ref 26.0–34.0)
MCHC: 31.9 g/dL (ref 30.0–36.0)
MCV: 94.9 fL (ref 80.0–100.0)
Platelets: 233 10*3/uL (ref 150–400)
RBC: 2.97 MIL/uL — ABNORMAL LOW (ref 3.87–5.11)
RDW: 13.1 % (ref 11.5–15.5)
WBC: 8.2 10*3/uL (ref 4.0–10.5)
nRBC: 0 % (ref 0.0–0.2)

## 2019-06-27 LAB — MAGNESIUM: Magnesium: 2.4 mg/dL (ref 1.7–2.4)

## 2019-06-27 MED ORDER — ACETAMINOPHEN 500 MG PO TABS
500.0000 mg | ORAL_TABLET | Freq: Three times a day (TID) | ORAL | Status: DC
  Administered 2019-06-27 – 2019-07-01 (×8): 500 mg via ORAL
  Filled 2019-06-27 (×9): qty 1

## 2019-06-27 MED ORDER — POTASSIUM CHLORIDE CRYS ER 20 MEQ PO TBCR
40.0000 meq | EXTENDED_RELEASE_TABLET | Freq: Once | ORAL | Status: AC
  Administered 2019-06-27: 40 meq via ORAL
  Filled 2019-06-27: qty 2

## 2019-06-27 NOTE — Progress Notes (Signed)
PROGRESS NOTE    Rachel Hughes  IRS:[id number] DOB: October 21, 1933 DOA: 06/21/2019 PCP: Vivi Barrack, MD     Brief Narrative:  Rachel Hughes is a 85 year old Mandarin speaking female with no significant past medical history presented to the hospital with epigastric pain  for 5 days.  She has not seen a physician since 2018.  She has never had a mammogram to her knowledge, if she had it was over 20 years ago likely. She states that she has had some intermittent abdominal pain in the past, always resolved at home but this time it has persisted which prompted her to seek care in the hospital.  Had no other complaints on admission.  In the emergency department, CT abdomen pelvis revealed distended gallbladder with gallbladder wall thickening raising concern for acute cholecystitis, multiple masses in the left breast tissue, mass adjacent to pancreatic body and head.  General surgery was consulted as well as GI and oncology.    Assessment & Plan:   Principal Problem:   Abdominal pain Active Problems:   Gallbladder sludge   Pancreatic mass   Mass of breast, left   Cancer (HCC)   Right upper quadrant abdominal pain, chronic cholecystitis RUQ [place]: Large amount of sludge within gallbladder with borderline gallbladder wall thickness, no visible stones.  Patient was seen by general surgery and underwent cholecystectomy, excisional biopsy lymph node, Dr. gross 8/3. Advance diet as tolerated, per general surgery.  Patient will need to ambulate.  Patient does have a drain , management as per surgery.  PT evaluation appreciated and recommended home health on discharge with a rolling walker.  New Left breast mass x2 likely breast cancer, peri-pancreatic lymphadenopathy  -CT abdomen pelvis revealed distended gallbladder with gallbladder wall thickening.  Multiple masses in the left breast tissue.  3.2 cm and 2.5 cm masses adjacent to the pancreatic body and head. CT chest revealed 2 adjacent rounded soft tissue  masses in the left breast suspicious for breast cancer, no CT findings of metastatic disease involving chest/lung, no lytic or sclerotic bone lesion to suggest osseous metastatic disease -GI and IR reviewed the patient's imaging, patient's pancreas appeared normal.  IR recommended outpatient mammogram, ultrasound guided biopsy of left breast mass.  Patient has been seen by Dr. Burr Medico oncology as well. Dr. Paulita Fujita, GI, reviewed case and does not feel EUS indicated.  I spoke with Dr. Burr Medico oncology today.  There is a possibility of poorly differentiated carcinoma favoring adenocarcinoma.  She will discuss this finding with the family today.  Mild cough and upper abdominal tightness.  X-ray of the chest shows mild right lower lobe atelectasis.  Incentive and spirometry ambulation encouraged including deep breathing.  DVT prophylaxis: Lovenox   Code Status: Full  Family Communication: Daughter at bedside, updated her about the clinical condition of the patient.  Disposition Plan: Pending clinical improvement.  Likely home with home health in 1 to 2 days.  Follow surgical recommendations prior to discharge, patient still has a drain.  Continue to ambulate the patient.    Continue incentive spirometry.  Oncology Dr. Burr Medico to discuss with the patient as well as family regarding further treatment plan.   Consultants:   General surgery  GI  Oncology  Procedures:  Laparoscopic cholecystectomy, placement of JP drain,excisional biopsy porta hepatis lymph nodes, Dr. Johney Maine, 08/03  Antimicrobials:  Anti-infectives (From admission, onward)   Start     Dose/Rate Route Frequency Ordered Stop   06/25/19 0600  ceFAZolin (ANCEF) IVPB 2g/100 mL  premix  Status:  Discontinued     2 g 200 mL/hr over 30 Minutes Intravenous On call to O.R. 06/24/19 1324 06/24/19 1402   06/25/19 0600  metroNIDAZOLE (FLAGYL) IVPB 500 mg  Status:  Discontinued     500 mg 100 mL/hr over 60 Minutes Intravenous On call to O.R. 06/24/19  1324 06/24/19 1402   06/24/19 2200  piperacillin-tazobactam (ZOSYN) IVPB 3.375 g     3.375 g 12.5 mL/hr over 240 Minutes Intravenous Every 8 hours 06/24/19 2004 06/26/19 0927   06/24/19 1415  ceFAZolin (ANCEF) IVPB 2g/100 mL premix     2 g 200 mL/hr over 30 Minutes Intravenous On call to O.R. 06/24/19 1402 06/24/19 1507   06/24/19 1415  metroNIDAZOLE (FLAGYL) IVPB 500 mg     500 mg 100 mL/hr over 60 Minutes Intravenous On call to O.R. 06/24/19 1402 06/24/19 1528   06/22/19 0600  piperacillin-tazobactam (ZOSYN) IVPB 3.375 g  Status:  Discontinued     3.375 g 12.5 mL/hr over 240 Minutes Intravenous Every 8 hours 06/22/19 0527 06/22/19 1211   06/21/19 2315  piperacillin-tazobactam (ZOSYN) IVPB 3.375 g     3.375 g 100 mL/hr over 30 Minutes Intravenous  Once 06/21/19 2306 06/22/19 0014     Subjective: Spoke with the patient's daughter at bedside who also translates for the patient.  Patient states that she feels a little better today.  Denies any nausea, vomiting or overt abdominal pain but mild upper abdominal tightness and mild cough..  She does complain of generalized weakness.  Has not had a bowel movement.  But is passing flatus.  Objective: Vitals:   06/26/19 1346 06/26/19 2119 06/27/19 0425 06/27/19 1324  BP: (!) 99/58 (!) 91/59 (!) 109/55 97/76  Pulse: 88 80 78 77  Resp: 16 18 19 14   Temp: 98.3 F (36.8 C) 98.7 F (37.1 C) 98.7 F (37.1 C) 98.6 F (37 C)  TempSrc: Oral Oral Oral Oral  SpO2: 96% 96% 96% 94%  Weight:   62.4 kg     Intake/Output Summary (Last 24 hours) at 06/27/2019 1435 Last data filed at 06/27/2019 1400 Gross per 24 hour  Intake 600 ml  Output 95 ml  Net 505 ml   Filed Weights   06/25/19 0500 06/26/19 0500 06/27/19 0425  Weight: 63.3 kg 63.6 kg 62.4 kg    Physical examination: General:  Average built, not in obvious distress HENT: Normocephalic, pupils equally reacting to light and accommodation.  No scleral pallor or icterus noted. Oral mucosa is  moist.  Chest:  .  Diminished breath sounds bilaterally.  CVS: S1 &S2 heard. No murmur.  Regular rate and rhythm. Abdomen: Soft and nondistended.  Positive bowel sounds.  Incision clean dry and intact with tenderness to palpation on the right epigastric and upper quadrant region.  JP drain in place with minimal sanguinous discharge.. Extremities: No cyanosis, clubbing or edema.  Peripheral pulses are palpable. Psych: Alert, awake and oriented, normal mood CNS:  No cranial nerve deficits.  Power equal in all extremities.  No sensory deficits noted.  No cerebellar signs.   Skin: Warm and dry.  Status post laparoscopic cholecystectomy   Data Reviewed: I have personally reviewed following labs and imaging studies  CBC: Recent Labs  Lab 06/24/19 0537 06/25/19 0510 06/25/19 1346 06/26/19 0522 06/27/19 0516  WBC 7.6 9.1 12.3* 9.1 8.2  HGB 12.1 9.8* 9.6* 8.9* 9.0*  HCT 37.6 30.9* 29.9* 27.7* 28.2*  MCV 94.5 95.4 93.7 94.9 94.9  PLT 253 241  241 204 470   Basic Metabolic Panel: Recent Labs  Lab 06/22/19 0622 06/24/19 0537 06/25/19 0510 06/26/19 0522 06/27/19 0516  NA 137 139 136 137 139  K 3.5 3.4* 4.7 3.6 3.4*  CL 104 104 106 105 104  CO2 25 24 24 26 28   GLUCOSE 123* 114* 157* 118* 134*  BUN 12 14 14 10 9   CREATININE 0.75 0.70 0.81 0.86 0.79  CALCIUM 8.4* 8.7* 8.2* 8.1* 8.3*  MG  --   --  2.2  --  2.4   GFR: CrCl cannot be calculated (Unknown ideal weight.). Liver Function Tests: Recent Labs  Lab 06/22/19 0622 06/24/19 0537 06/25/19 0510 06/26/19 0522 06/27/19 0516  AST 19 20 80* 45* 35  ALT 15 15 32 26 25  ALKPHOS 61 68 49 48 59  BILITOT 1.1 1.1 0.7 0.5 0.7  PROT 6.9 7.4 5.9* 6.0* 6.7  ALBUMIN 3.5 3.5 2.7* 2.9* 2.9*   Recent Labs  Lab 06/21/19 1322  LIPASE 34   No results for input(s): AMMONIA in the last 168 hours. Coagulation Profile: Recent Labs  Lab 06/22/19 0622  INR 1.1   Cardiac Enzymes: No results for input(s): CKTOTAL, CKMB, CKMBINDEX,  TROPONINI in the last 168 hours. BNP (last 3 results) No results for input(s): PROBNP in the last 8760 hours. HbA1C: No results for input(s): HGBA1C in the last 72 hours. CBG: No results for input(s): GLUCAP in the last 168 hours. Lipid Profile: No results for input(s): CHOL, HDL, LDLCALC, TRIG, CHOLHDL, LDLDIRECT in the last 72 hours. Thyroid Function Tests: No results for input(s): TSH, T4TOTAL, FREET4, T3FREE, THYROIDAB in the last 72 hours. Anemia Panel: No results for input(s): VITAMINB12, FOLATE, FERRITIN, TIBC, IRON, RETICCTPCT in the last 72 hours. Sepsis Labs: Recent Labs  Lab 06/26/19 0522 06/26/19 0827  LATICACIDVEN 1.3 1.3    Recent Results (from the past 240 hour(s))  SARS Coronavirus 2 [facility] order, Performed in [facility] hospital lab) Nasopharyngeal Nasopharyngeal Swab     Status: None   Collection Time: 06/21/19 10:56 PM   Specimen: Nasopharyngeal Swab  Result Value Ref Range Status   SARS Coronavirus 2 NEGATIVE NEGATIVE Final    Comment: (NOTE) If result is NEGATIVE SARS-CoV-2 target nucleic acids are NOT DETECTED. The SARS-CoV-2 RNA is generally detectable in upper and lower  respiratory specimens during the acute phase of infection. The lowest  concentration of SARS-CoV-2 viral copies this assay can detect is 250  copies / mL. A negative result does not preclude SARS-CoV-2 infection  and should not be used as the sole basis for treatment or other  patient management decisions.  A negative result may occur with  improper specimen collection / handling, submission of specimen other  than nasopharyngeal swab, presence of viral mutation(s) within the  areas targeted by this assay, and inadequate number of viral copies  (<250 copies / mL). A negative result must be combined with clinical  observations, patient history, and epidemiological information. If result is POSITIVE SARS-CoV-2 target nucleic acids are DETECTED. The SARS-CoV-2 RNA is generally  detectable in upper and lower  respiratory specimens dur ing the acute phase of infection.  Positive  results are indicative of active infection with SARS-CoV-2.  Clinical  correlation with patient history and other diagnostic information is  necessary to determine patient infection status.  Positive results do  not rule out bacterial infection or co-infection with other viruses. If result is PRESUMPTIVE POSTIVE SARS-CoV-2 nucleic acids MAY BE PRESENT.   A presumptive positive  result was obtained on the submitted specimen  and confirmed on repeat testing.  While 2019 novel coronavirus  (SARS-CoV-2) nucleic acids may be present in the submitted sample  additional confirmatory testing may be necessary for epidemiological  and / or clinical management purposes  to differentiate between  SARS-CoV-2 and other Sarbecovirus currently known to infect humans.  If clinically indicated additional testing with an alternate test  methodology [phone]) is advised. The SARS-CoV-2 RNA is generally  detectable in upper and lower respiratory sp ecimens during the acute  phase of infection. The expected result is Negative. Fact Sheet for Patients:  StrictlyIdeas.no Fact Sheet for Healthcare Providers: BankingDealers.co.za This test is not yet approved or cleared by the [place] FDA and has been authorized for detection and/or diagnosis of SARS-CoV-2 by FDA under an Emergency Use Authorization (EUA).  This EUA will remain in effect (meaning this test can be used) for the duration of the COVID-19 declaration under Section 564(b)(1) of the Act, 21 U.S.C. section 360bbb-3(b)(1), unless the authorization is terminated or revoked sooner. Performed at [facility], [place] [address], [place], [place] [address]   Surgical pcr screen     Status: Abnormal   Collection Time: 06/24/19  4:00 AM   Specimen: Nasal Mucosa; Nasal Swab  Result Value  Ref Range Status   MRSA, PCR POSITIVE (A) NEGATIVE Final    Comment: RESULT CALLED TO, READ BACK BY AND VERIFIED WITH: BETHEL AT 0913 ON 06/24/2019 BY JPM    Staphylococcus aureus POSITIVE (A) NEGATIVE Final    Comment: (NOTE) The Xpert SA Assay (FDA approved for NASAL specimens in patients 71 years of age and older), is one component of a comprehensive surveillance program. It is not intended to diagnose infection nor to guide or monitor treatment. Performed at [facility], [place] [address], [place], [place] [address]       Radiology Studies: Dg Chest Port 1 View  Result Date: 06/27/2019 CLINICAL DATA:  Chest tightness. EXAM: PORTABLE CHEST 1 VIEW COMPARISON:  CT chest 06/22/2019 FINDINGS: Mild right lower lobe atelectasis. No definite pneumonia. Negative for heart failure or effusion. No mass lesion IMPRESSION: Mild right lower lobe atelectasis. Electronically Signed   By: Franchot Gallo M.D.   On: 06/27/2019 10:15      Scheduled Meds: . acetaminophen  500 mg Oral TID  . Chlorhexidine Gluconate Cloth  6 each Topical Q0600  . Chlorhexidine Gluconate Cloth  6 each Topical Once  . enoxaparin (LOVENOX) injection  40 mg Subcutaneous Q24H  . lip balm  1 application Topical BID  . multivitamin with minerals  1 tablet Oral Daily  . mupirocin ointment  1 application Nasal BID  . pantoprazole  40 mg Oral Daily  . polyethylene glycol  17 g Oral Daily   Continuous Infusions: . albumin human       LOS: 6 days    Flora Lipps, MD [facility] [email or web address] 06/27/2019, 2:35 PM

## 2019-06-27 NOTE — Progress Notes (Signed)
Oncology follow up note   I met pt's daughter outside her room, and discussed pt's surgical path findings. 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 I have asked Dr. Barry Dienes to review her CT scan to see if her metastatic nodes are still resectable.   Pt's daughter voiced good understanding the incurable nature and overall poor prognosis if her node metastasis can not be removed completely, and the high risk of recurrence even if she undergo complete surgical resection. She is very concerned about if her mother can tolerate another surgery due to her slowly recovery from cholecystectomy. She will discuss with her brother tonight.   I plan to discuss with pt tomorrow.   Current goal is pain control and mobilization. Due to her drowsiness from strong narcotics, may consider tylenol #3 or tramadol.   Rachel Hughes  06/27/2019

## 2019-06-27 NOTE — Progress Notes (Signed)
[facility]/Trauma Progress Note  3 Days Post-Op   Assessment/Plan Pancreatic lymphadenopathy Left breast mass - oncology following  Gallbladder distension and sludge, RUQ pain- chronic cholecystitis -S/P lap chole, LOA, excisional biopsy porta hepatis lymph nodes, Dr. Johney Maine, 08/03 - JP drain output down to 145 in last 24hrs - path per oncology showed poorly differentiated carcinoma, favor adeno. Final report pending. primary gladder tumor vs metastatic disease? (breast markers ordered). Did not discuss this with pt or daughter. Will defer this to oncology.  - ambulate, PT rec HH, IS ABLA - Hgb 9.0, stable, monitor hgb and drain output  FEN:HH diet VTE: SCD's, lovenox EH:MCNOB 07/31-08/05   WBC 9.1 Foley:none Follow up:TBD  DISPO:pt needs to ambulate. PT recs HH. IS, CXR pending in setting of lower chest/upper abdominal tightness. Monitor Hgb and drain output.    LOS: 6 days    Subjective: CC: upper abdominal/lower chest tightness  Daughter assisted in translation. Pt is stating upper abd/lower chest tightness "like a rope" that is a constant pain since surgery. This pain is improved with lying down and worse with sitting up. The drain site is uncomfortable and causing her pain. No lower abdominal pain. Food does not make this pain worse. She is eating, ambulating more and overall feeling better. She is not taking deep breaths due to pain. Having a productive cough with thick mucus. Pt is not taking Norco. I encouraged pt to take as needed to help with ambulation and when using IS.   Objective: Vital signs in last 24 hours: Temp:  [98.3 F (36.8 C)-98.7 F (37.1 C)] 98.7 F (37.1 C) (08/06 0425) Pulse Rate:  [78-88] 78 (08/06 0425) Resp:  [16-19] 19 (08/06 0425) BP: (91-109)/(55-59) 109/55 (08/06 0425) SpO2:  [96 %] 96 % (08/06 0425) Weight:  [62.4 kg] 62.4 kg (08/06 0425) Last BM Date: 07/11/19  Intake/Output from previous day: 08/05 0701 -  08/06 0700 In: 490 [P.O.:480] Out: 145 [Drains:145] Intake/Output this shift: No intake/output data recorded.  PE: Gen: Alert, NAD, pleasant, cooperative Pulm:Rate andeffort normal Abd: Soft, ND, +BS, incisions C/D/I, drain with minimal sanguinous drainage,TTP RUQ and epigastric port site area with guarding. No peritonitis Skin: no rashes noted, warm and dry   Anti-infectives: Anti-infectives (From admission, onward)   Start     Dose/Rate Route Frequency Ordered Stop   06/25/19 0600  ceFAZolin (ANCEF) IVPB 2g/100 mL premix  Status:  Discontinued     2 g 200 mL/hr over 30 Minutes Intravenous On call to O.R. 06/24/19 1324 06/24/19 1402   06/25/19 0600  metroNIDAZOLE (FLAGYL) IVPB 500 mg  Status:  Discontinued     500 mg 100 mL/hr over 60 Minutes Intravenous On call to O.R. 06/24/19 1324 06/24/19 1402   06/24/19 2200  piperacillin-tazobactam (ZOSYN) IVPB 3.375 g     3.375 g 12.5 mL/hr over 240 Minutes Intravenous Every 8 hours 06/24/19 2004 06/26/19 0927   06/24/19 1415  ceFAZolin (ANCEF) IVPB 2g/100 mL premix     2 g 200 mL/hr over 30 Minutes Intravenous On call to O.R. 06/24/19 1402 06/24/19 1507   06/24/19 1415  metroNIDAZOLE (FLAGYL) IVPB 500 mg     500 mg 100 mL/hr over 60 Minutes Intravenous On call to O.R. 06/24/19 1402 06/24/19 1528   06/22/19 0600  piperacillin-tazobactam (ZOSYN) IVPB 3.375 g  Status:  Discontinued     3.375 g 12.5 mL/hr over 240 Minutes Intravenous Every 8 hours 06/22/19 0527 06/22/19 1211   06/21/19 2315  piperacillin-tazobactam (ZOSYN) IVPB 3.375  g     3.375 g 100 mL/hr over 30 Minutes Intravenous  Once 06/21/19 2306 06/22/19 0014      Lab Results:  Recent Labs    06/26/19 0522 06/27/19 0516  WBC 9.1 8.2  HGB 8.9* 9.0*  HCT 27.7* 28.2*  PLT 204 233   BMET Recent Labs    06/26/19 0522 06/27/19 0516  NA 137 139  K 3.6 3.4*  CL 105 104  CO2 26 28  GLUCOSE 118* 134*  BUN 10 9  CREATININE 0.86 0.79  CALCIUM 8.1* 8.3*    PT/INR No results for input(s): LABPROT, INR in the last 72 hours. CMP     Component Value Date/Time   NA 139 06/27/2019 0516   K 3.4 (L) 06/27/2019 0516   CL 104 06/27/2019 0516   CO2 28 06/27/2019 0516   GLUCOSE 134 (H) 06/27/2019 0516   BUN 9 06/27/2019 0516   CREATININE 0.79 06/27/2019 0516   CALCIUM 8.3 (L) 06/27/2019 0516   PROT 6.7 06/27/2019 0516   ALBUMIN 2.9 (L) 06/27/2019 0516   AST 35 06/27/2019 0516   ALT 25 06/27/2019 0516   ALKPHOS 59 06/27/2019 0516   BILITOT 0.7 06/27/2019 0516   GFRNONAA >60 06/27/2019 0516   GFRAA >60 06/27/2019 0516   Lipase     Component Value Date/Time   LIPASE 34 06/21/2019 1322    Studies/Results: No results found.    Kalman Drape , [facility] Surgery 06/27/2019, 9:50 AM  Pager: [phone] Mon-Wed, Friday 7:00am-4:30pm Thurs 7am-11:30am  Consults: [phone]

## 2019-06-28 DIAGNOSIS — C23 Malignant neoplasm of gallbladder: Secondary | ICD-10-CM

## 2019-06-28 DIAGNOSIS — K8689 Other specified diseases of pancreas: Secondary | ICD-10-CM

## 2019-06-28 DIAGNOSIS — Z789 Other specified health status: Secondary | ICD-10-CM

## 2019-06-28 LAB — CBC
HCT: 27.6 % — ABNORMAL LOW (ref 36.0–46.0)
Hemoglobin: 8.6 g/dL — ABNORMAL LOW (ref 12.0–15.0)
MCH: 29.7 pg (ref 26.0–34.0)
MCHC: 31.2 g/dL (ref 30.0–36.0)
MCV: 95.2 fL (ref 80.0–100.0)
Platelets: 253 10*3/uL (ref 150–400)
RBC: 2.9 MIL/uL — ABNORMAL LOW (ref 3.87–5.11)
RDW: 13 % (ref 11.5–15.5)
WBC: 7.9 10*3/uL (ref 4.0–10.5)
nRBC: 0 % (ref 0.0–0.2)

## 2019-06-28 LAB — COMPREHENSIVE METABOLIC PANEL
ALT: 45 U/L — ABNORMAL HIGH (ref 0–44)
AST: 75 U/L — ABNORMAL HIGH (ref 15–41)
Albumin: 2.9 g/dL — ABNORMAL LOW (ref 3.5–5.0)
Alkaline Phosphatase: 60 U/L (ref 38–126)
Anion gap: 10 (ref 5–15)
BUN: 10 mg/dL (ref 8–23)
CO2: 23 mmol/L (ref 22–32)
Calcium: 8.7 mg/dL — ABNORMAL LOW (ref 8.9–10.3)
Chloride: 104 mmol/L (ref 98–111)
Creatinine, Ser: 0.66 mg/dL (ref 0.44–1.00)
GFR calc Af Amer: >60 mL/min (ref >60)
GFR calc non Af Amer: >60 mL/min (ref >60)
Glucose, Bld: 110 mg/dL — ABNORMAL HIGH (ref 70–99)
Potassium: 3.9 mmol/L (ref 3.5–5.1)
Sodium: 137 mmol/L (ref 135–145)
Total Bilirubin: 0.4 mg/dL (ref 0.3–1.2)
Total Protein: 6.4 g/dL — ABNORMAL LOW (ref 6.5–8.1)

## 2019-06-28 LAB — MAGNESIUM: Magnesium: 2.1 mg/dL (ref 1.7–2.4)

## 2019-06-28 MED ORDER — DIPHENHYDRAMINE HCL 25 MG PO CAPS
25.0000 mg | ORAL_CAPSULE | Freq: Once | ORAL | Status: AC
  Administered 2019-06-28: 25 mg via ORAL
  Filled 2019-06-28: qty 1

## 2019-06-28 NOTE — Progress Notes (Addendum)
PROGRESS NOTE    Rachel Hughes  YKD:[id number] DOB: [birth year] DOA: 06/21/2019 PCP: Vivi Barrack, MD     Brief Narrative:  Rachel Hughes is a 85 year old Mandarin speaking female with no significant past medical history presented to the hospital with epigastric pain  for 5 days.  She has not seen a physician since 2018.  She has never had a mammogram to her knowledge, if she had it was over 20 years ago likely. She states that she has had some intermittent abdominal pain in the past, always resolved at home but this time it has persisted which prompted her to seek care in the hospital.  Had no other complaints on admission.  In the emergency department, CT abdomen pelvis revealed distended gallbladder with gallbladder wall thickening raising concern for acute cholecystitis, multiple masses in the left breast tissue, mass adjacent to pancreatic body and head.  General surgery was consulted as well as GI and oncology.   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 RUQ [place]: Large amount of sludge within gallbladder with borderline gallbladder wall thickness, no visible stones.  Patient was seen by general surgery and underwent cholecystectomy, excisional biopsy lymph node, Dr. gross 8/3.  Likely primary gallbladder malignancy with lymphadenopathy.  Advance diet as tolerated. Patient will need to ambulate.  Surgery recommends continuation of drain for 10 to 20 days.   PT evaluation appreciated and recommended home health on discharge with a rolling walker.  New Left breast mass x2 likely breast cancer, peri-pancreatic lymphadenopathy  -CT abdomen pelvis revealed distended gallbladder with gallbladder wall thickening.  Multiple masses in the left  breast tissue.  3.2 cm and 2.5 cm masses adjacent to the pancreatic body and head. CT chest revealed 2 adjacent rounded soft tissue masses in the left breast suspicious for breast cancer, no CT findings of metastatic disease involving chest/lung, no lytic or sclerotic bone lesion to suggest osseous metastatic disease -GI and IR reviewed the patient's imaging, patient's pancreas appeared normal.  IR recommended outpatient mammogram, ultrasound guided biopsy of left breast mass.  Patient has been seen by Dr. Burr Medico oncology as well. Dr. Paulita Fujita, GI, reviewed case and does not feel EUS indicated.  Spoke with Dr. Burr Medico oncology yesterday and there is strong suspicion of poorly differentiated carcinoma favoring adenocarcinoma.  Family will discuss regarding further treatment options  Mild cough and upper abdominal tightness.  Improved.  X-ray of the chest shows mild right lower lobe atelectasis.  Incentive and spirometry ambulation encouraged including deep breathing.  DVT prophylaxis: Lovenox   Code Status: Full  Family Communication: Daughter at bedside, updated her about the clinical condition of the patient.  Disposition Plan: As per surgery, likely home with home health in 1 to 2 days.   Continue to ambulate the patient.    Continue incentive spirometry.  Oncology Dr. Burr Medico to discuss with the patient as well as family regarding further treatment plan today.   Consultants:   General surgery  GI  Oncology  Procedures:  Laparoscopic cholecystectomy, placement of JP drain,excisional biopsy porta hepatis lymph nodes, Dr. Johney Maine, 08/03  Antimicrobials:  Anti-infectives (From admission, onward)   Start     Dose/Rate Route Frequency Ordered Stop   06/25/19  0600  ceFAZolin (ANCEF) IVPB 2g/100 mL premix  Status:  Discontinued     2 g 200 mL/hr over 30 Minutes Intravenous On call to O.R. 06/24/19 1324 06/24/19 1402   06/25/19 0600  metroNIDAZOLE (FLAGYL) IVPB 500 mg  Status:  Discontinued     500 mg  100 mL/hr over 60 Minutes Intravenous On call to O.R. 06/24/19 1324 06/24/19 1402   06/24/19 2200  piperacillin-tazobactam (ZOSYN) IVPB 3.375 g     3.375 g 12.5 mL/hr over 240 Minutes Intravenous Every 8 hours 06/24/19 2004 06/26/19 0927   06/24/19 1415  ceFAZolin (ANCEF) IVPB 2g/100 mL premix     2 g 200 mL/hr over 30 Minutes Intravenous On call to O.R. 06/24/19 1402 06/24/19 1507   06/24/19 1415  metroNIDAZOLE (FLAGYL) IVPB 500 mg     500 mg 100 mL/hr over 60 Minutes Intravenous On call to O.R. 06/24/19 1402 06/24/19 1528   06/22/19 0600  piperacillin-tazobactam (ZOSYN) IVPB 3.375 g  Status:  Discontinued     3.375 g 12.5 mL/hr over 240 Minutes Intravenous Every 8 hours 06/22/19 0527 06/22/19 1211   06/21/19 2315  piperacillin-tazobactam (ZOSYN) IVPB 3.375 g     3.375 g 100 mL/hr over 30 Minutes Intravenous  Once 06/21/19 2306 06/22/19 0014     Subjective: Spoke with the patient's daughter at bedside who also translates for the patient.  Patient overall feels better.  She denies any nausea or vomiting.Marland Kitchen  Has had some flatus but no bowel movement.  Objective: Vitals:   06/27/19 2057 06/28/19 0500 06/28/19 0633 06/28/19 0749  BP: 124/76  108/62 108/62  Pulse: 79  74 74  Resp: 18  19 18   Temp: 98.7 F (37.1 C)  98.4 F (36.9 C) 98.4 F (36.9 C)  TempSrc: Oral  Oral Oral  SpO2: 97%  94%   Weight:  62.1 kg  62.1 kg  Height:    5' (1.524 m)    Intake/Output Summary (Last 24 hours) at 06/28/2019 1204 Last data filed at 06/28/2019 0900 Gross per 24 hour  Intake 480 ml  Output 125 ml  Net 355 ml   Filed Weights   06/27/19 0425 06/28/19 0500 06/28/19 0749  Weight: 62.4 kg 62.1 kg 62.1 kg    Physical examination: General:  Average built, not in obvious distress HENT: Normocephalic, pupils equally reacting to light and accommodation.  No scleral pallor or icterus noted. Oral mucosa is moist.  Chest:   Diminished breath sounds bilaterally. No crackles or wheezes.  CVS: S1 &S2  heard. No murmur.  Regular rate and rhythm. Abdomen: Soft, positive bowel sounds.  Incision site clean dry and intact with tenderness on palpation.  JP drain in place with minimal sanguinous discharge. Extremities: No cyanosis, clubbing or edema.  Peripheral pulses are palpable. Psych: Alert, awake and communicative. CNS:  No cranial nerve deficits.  Power equal in all extremities.  No sensory deficits noted.  No cerebellar signs.   Skin: Warm and dry.  Status post laparoscopic cholecystectomy with a drain.   Data Reviewed: I have personally reviewed following labs and imaging studies  CBC: Recent Labs  Lab 06/25/19 0510 06/25/19 1346 06/26/19 0522 06/27/19 0516 06/28/19 0443  WBC 9.1 12.3* 9.1 8.2 7.9  HGB 9.8* 9.6* 8.9* 9.0* 8.6*  HCT 30.9* 29.9* 27.7* 28.2* 27.6*  MCV 95.4 93.7 94.9 94.9 95.2  PLT 241 241 204 233 350   Basic Metabolic Panel: Recent Labs  Lab 06/24/19 0537 06/25/19 0510 06/26/19 0522 06/27/19 0516 06/28/19  0443  NA 139 136 137 139 137  K 3.4* 4.7 3.6 3.4* 3.9  CL 104 106 105 104 104  CO2 24 24 26 28 23   GLUCOSE 114* 157* 118* 134* 110*  BUN 14 14 10 9 10   CREATININE 0.70 0.81 0.86 0.79 0.66  CALCIUM 8.7* 8.2* 8.1* 8.3* 8.7*  MG  --  2.2  --  2.4 2.1   GFR: Estimated Creatinine Clearance: 43.8 mL/min (by C-G formula based on SCr of 0.66 mg/dL). Liver Function Tests: Recent Labs  Lab 06/24/19 0537 06/25/19 0510 06/26/19 0522 06/27/19 0516 06/28/19 0443  AST 20 80* 45* 35 75*  ALT 15 32 26 25 45*  ALKPHOS 68 49 48 59 60  BILITOT 1.1 0.7 0.5 0.7 0.4  PROT 7.4 5.9* 6.0* 6.7 6.4*  ALBUMIN 3.5 2.7* 2.9* 2.9* 2.9*   Recent Labs  Lab 06/21/19 1322  LIPASE 34   No results for input(s): AMMONIA in the last 168 hours. Coagulation Profile: Recent Labs  Lab 06/22/19 0622  INR 1.1   Cardiac Enzymes: No results for input(s): CKTOTAL, CKMB, CKMBINDEX, TROPONINI in the last 168 hours. BNP (last 3 results) No results for input(s): PROBNP in  the last 8760 hours. HbA1C: No results for input(s): HGBA1C in the last 72 hours. CBG: No results for input(s): GLUCAP in the last 168 hours. Lipid Profile: No results for input(s): CHOL, HDL, LDLCALC, TRIG, CHOLHDL, LDLDIRECT in the last 72 hours. Thyroid Function Tests: No results for input(s): TSH, T4TOTAL, FREET4, T3FREE, THYROIDAB in the last 72 hours. Anemia Panel: No results for input(s): VITAMINB12, FOLATE, FERRITIN, TIBC, IRON, RETICCTPCT in the last 72 hours. Sepsis Labs: Recent Labs  Lab 06/26/19 0522 06/26/19 0827  LATICACIDVEN 1.3 1.3    Recent Results (from the past 240 hour(s))  SARS Coronavirus 2 [facility] order, Performed in [facility] hospital lab) Nasopharyngeal Nasopharyngeal Swab     Status: None   Collection Time: 06/21/19 10:56 PM   Specimen: Nasopharyngeal Swab  Result Value Ref Range Status   SARS Coronavirus 2 NEGATIVE NEGATIVE Final    Comment: (NOTE) If result is NEGATIVE SARS-CoV-2 target nucleic acids are NOT DETECTED. The SARS-CoV-2 RNA is generally detectable in upper and lower  respiratory specimens during the acute phase of infection. The lowest  concentration of SARS-CoV-2 viral copies this assay can detect is 250  copies / mL. A negative result does not preclude SARS-CoV-2 infection  and should not be used as the sole basis for treatment or other  patient management decisions.  A negative result may occur with  improper specimen collection / handling, submission of specimen other  than nasopharyngeal swab, presence of viral mutation(s) within the  areas targeted by this assay, and inadequate number of viral copies  (<250 copies / mL). A negative result must be combined with clinical  observations, patient history, and epidemiological information. If result is POSITIVE SARS-CoV-2 target nucleic acids are DETECTED. The SARS-CoV-2 RNA is generally detectable in upper and lower  respiratory specimens dur ing the acute phase of infection.   Positive  results are indicative of active infection with SARS-CoV-2.  Clinical  correlation with patient history and other diagnostic information is  necessary to determine patient infection status.  Positive results do  not rule out bacterial infection or co-infection with other viruses. If result is PRESUMPTIVE POSTIVE SARS-CoV-2 nucleic acids MAY BE PRESENT.   A presumptive positive result was obtained on the submitted specimen  and confirmed on repeat testing.  While 2019  novel coronavirus  (SARS-CoV-2) nucleic acids may be present in the submitted sample  additional confirmatory testing may be necessary for epidemiological  and / or clinical management purposes  to differentiate between  SARS-CoV-2 and other Sarbecovirus currently known to infect humans.  If clinically indicated additional testing with an alternate test  methodology [id number]) is advised. The SARS-CoV-2 RNA is generally  detectable in upper and lower respiratory sp ecimens during the acute  phase of infection. The expected result is Negative. Fact Sheet for Patients:  StrictlyIdeas.no Fact Sheet for Healthcare Providers: BankingDealers.co.za This test is not yet approved or cleared by the [place] FDA and has been authorized for detection and/or diagnosis of SARS-CoV-2 by FDA under an Emergency Use Authorization (EUA).  This EUA will remain in effect (meaning this test can be used) for the duration of the COVID-19 declaration under Section 564(b)(1) of the Act, 21 U.S.C. section 360bbb-3(b)(1), unless the authorization is terminated or revoked sooner. Performed at [facility], [place] [address], [place], [place] [address]   Surgical pcr screen     Status: Abnormal   Collection Time: 06/24/19  4:00 AM   Specimen: Nasal Mucosa; Nasal Swab  Result Value Ref Range Status   MRSA, PCR POSITIVE (A) NEGATIVE Final    Comment: RESULT CALLED TO, READ  BACK BY AND VERIFIED WITH: BETHEL AT 0913 ON 06/24/2019 BY JPM    Staphylococcus aureus POSITIVE (A) NEGATIVE Final    Comment: (NOTE) The Xpert SA Assay (FDA approved for NASAL specimens in patients 28 years of age and older), is one component of a comprehensive surveillance program. It is not intended to diagnose infection nor to guide or monitor treatment. Performed at [facility], [place] [address], [place], [place] [address]       Radiology Studies: Dg Chest Port 1 View  Result Date: 06/27/2019 CLINICAL DATA:  Chest tightness. EXAM: PORTABLE CHEST 1 VIEW COMPARISON:  CT chest 06/22/2019 FINDINGS: Mild right lower lobe atelectasis. No definite pneumonia. Negative for heart failure or effusion. No mass lesion IMPRESSION: Mild right lower lobe atelectasis. Electronically Signed   By: Franchot Gallo M.D.   On: 06/27/2019 10:15      Scheduled Meds: . acetaminophen  500 mg Oral TID  . Chlorhexidine Gluconate Cloth  6 each Topical Q0600  . Chlorhexidine Gluconate Cloth  6 each Topical Once  . enoxaparin (LOVENOX) injection  40 mg Subcutaneous Q24H  . lip balm  1 application Topical BID  . multivitamin with minerals  1 tablet Oral Daily  . mupirocin ointment  1 application Nasal BID  . pantoprazole  40 mg Oral Daily  . polyethylene glycol  17 g Oral Daily   Continuous Infusions:    LOS: 7 days    Flora Lipps, MD [facility] [email or web address] 06/28/2019, 12:04 PM

## 2019-06-28 NOTE — Progress Notes (Signed)
Rachel Hughes   DOB:1933-08-26   PX#:[id number]   IOE#:[id number]  Oncology follow up   Subjective: Patient is overall improving, she took pain meds before PT this morning and was able to tolerate PT better. Eating well, has flats, no BM yet.   Objective:  Vitals:   06/28/19 0749 06/28/19 1328  BP: 108/62 93/61  Pulse: 74 98  Resp: 18 15  Temp: 98.4 F (36.9 C) 98.9 F (37.2 C)  SpO2:  94%    Body mass index is 26.74 kg/m.  Intake/Output Summary (Last 24 hours) at 06/28/2019 1832 Last data filed at 06/28/2019 1816 Gross per 24 hour  Intake 720 ml  Output 250 ml  Net 470 ml     Sclerae unicteric  Oropharynx clear  No peripheral adenopathy  Lungs clear -- no rales or rhonchi  Heart regular rate and rhythm  Abdomen soft, laparoscopic incision are healing well, (+) draining tube in RUQ, (+) tenderness in ride side abdomen  MSK no focal spinal tenderness, no peripheral edema  Neuro nonfocal   CBG (last 3)  No results for input(s): GLUCAP in the last 72 hours.   Labs:  Lab Results  Component Value Date   WBC 7.9 06/28/2019   HGB 8.6 (L) 06/28/2019   HCT 27.6 (L) 06/28/2019   MCV 95.2 06/28/2019   PLT 253 06/28/2019    Urine Studies No results for input(s): UHGB, CRYS in the last 72 hours.  Invalid input(s): UACOL, UAPR, USPG, UPH, UTP, UGL, UKET, UBIL, UNIT, UROB, [place], UEPI, UWBC, Duwayne Heck [place], [place]  Basic Metabolic Panel: Recent Labs  Lab 06/24/19 0537 06/25/19 0510 06/26/19 0522 06/27/19 0516 06/28/19 0443  NA 139 136 137 139 137  K 3.4* 4.7 3.6 3.4* 3.9  CL 104 106 105 104 104  CO2 24 24 26 28 23   GLUCOSE 114* 157* 118* 134* 110*  BUN 14 14 10 9 10   CREATININE 0.70 0.81 0.86 0.79 0.66  CALCIUM 8.7* 8.2* 8.1* 8.3* 8.7*  MG  --  2.2  --  2.4 2.1   GFR Estimated Creatinine Clearance: 43.8 mL/min (by C-G formula based on SCr of 0.66 mg/dL). Liver Function Tests: Recent Labs  Lab 06/24/19 0537 06/25/19 0510 06/26/19 0522 06/27/19 0516  06/28/19 0443  AST 20 80* 45* 35 75*  ALT 15 32 26 25 45*  ALKPHOS 68 49 48 59 60  BILITOT 1.1 0.7 0.5 0.7 0.4  PROT 7.4 5.9* 6.0* 6.7 6.4*  ALBUMIN 3.5 2.7* 2.9* 2.9* 2.9*   No results for input(s): LIPASE, AMYLASE in the last 168 hours. No results for input(s): AMMONIA in the last 168 hours. Coagulation profile Recent Labs  Lab 06/22/19 0622  INR 1.1    CBC: Recent Labs  Lab 06/25/19 0510 06/25/19 1346 06/26/19 0522 06/27/19 0516 06/28/19 0443  WBC 9.1 12.3* 9.1 8.2 7.9  HGB 9.8* 9.6* 8.9* 9.0* 8.6*  HCT 30.9* 29.9* 27.7* 28.2* 27.6*  MCV 95.4 93.7 94.9 94.9 95.2  PLT 241 241 204 233 253   Cardiac Enzymes: No results for input(s): CKTOTAL, CKMB, CKMBINDEX, TROPONINI in the last 168 hours. BNP: Invalid input(s): POCBNP CBG: No results for input(s): GLUCAP in the last 168 hours. D-Dimer No results for input(s): DDIMER in the last 72 hours. Hgb A1c No results for input(s): HGBA1C in the last 72 hours. Lipid Profile No results for input(s): CHOL, HDL, LDLCALC, TRIG, CHOLHDL, LDLDIRECT in the last 72 hours. Thyroid function studies No results for input(s): TSH, T4TOTAL,  T3FREE, THYROIDAB in the last 72 hours.  Invalid input(s): FREET3 Anemia work up No results for input(s): VITAMINB12, FOLATE, FERRITIN, TIBC, IRON, RETICCTPCT in the last 72 hours. Microbiology Recent Results (from the past 240 hour(s))  SARS Coronavirus 2 [facility] order, Performed in [facility] hospital lab) Nasopharyngeal Nasopharyngeal Swab     Status: None   Collection Time: 06/21/19 10:56 PM   Specimen: Nasopharyngeal Swab  Result Value Ref Range Status   SARS Coronavirus 2 NEGATIVE NEGATIVE Final    Comment: (NOTE) If result is NEGATIVE SARS-CoV-2 target nucleic acids are NOT DETECTED. The SARS-CoV-2 RNA is generally detectable in upper and lower  respiratory specimens during the acute phase of infection. The lowest  concentration of SARS-CoV-2 viral copies this assay can detect is  250  copies / mL. A negative result does not preclude SARS-CoV-2 infection  and should not be used as the sole basis for treatment or other  patient management decisions.  A negative result may occur with  improper specimen collection / handling, submission of specimen other  than nasopharyngeal swab, presence of viral mutation(s) within the  areas targeted by this assay, and inadequate number of viral copies  (<250 copies / mL). A negative result must be combined with clinical  observations, patient history, and epidemiological information. If result is POSITIVE SARS-CoV-2 target nucleic acids are DETECTED. The SARS-CoV-2 RNA is generally detectable in upper and lower  respiratory specimens dur ing the acute phase of infection.  Positive  results are indicative of active infection with SARS-CoV-2.  Clinical  correlation with patient history and other diagnostic information is  necessary to determine patient infection status.  Positive results do  not rule out bacterial infection or co-infection with other viruses. If result is PRESUMPTIVE POSTIVE SARS-CoV-2 nucleic acids MAY BE PRESENT.   A presumptive positive result was obtained on the submitted specimen  and confirmed on repeat testing.  While 2019 novel coronavirus  (SARS-CoV-2) nucleic acids may be present in the submitted sample  additional confirmatory testing may be necessary for epidemiological  and / or clinical management purposes  to differentiate between  SARS-CoV-2 and other Sarbecovirus currently known to infect humans.  If clinically indicated additional testing with an alternate test  methodology [id number]) is advised. The SARS-CoV-2 RNA is generally  detectable in upper and lower respiratory sp ecimens during the acute  phase of infection. The expected result is Negative. Fact Sheet for Patients:  StrictlyIdeas.no Fact Sheet for Healthcare  Providers: BankingDealers.co.za This test is not yet approved or cleared by the [place] FDA and has been authorized for detection and/or diagnosis of SARS-CoV-2 by FDA under an Emergency Use Authorization (EUA).  This EUA will remain in effect (meaning this test can be used) for the duration of the COVID-19 declaration under Section 564(b)(1) of the Act, 21 U.S.C. section 360bbb-3(b)(1), unless the authorization is terminated or revoked sooner. Performed at [facility], [place] [address], [place], [place] [address]   Surgical pcr screen     Status: Abnormal   Collection Time: 06/24/19  4:00 AM   Specimen: Nasal Mucosa; Nasal Swab  Result Value Ref Range Status   MRSA, PCR POSITIVE (A) NEGATIVE Final    Comment: RESULT CALLED TO, READ BACK BY AND VERIFIED WITH: BETHEL AT 0913 ON 06/24/2019 BY JPM    Staphylococcus aureus POSITIVE (A) NEGATIVE Final    Comment: (NOTE) The Xpert SA Assay (FDA approved for NASAL specimens in patients 63 years of age and older),  is one component of a comprehensive surveillance program. It is not intended to diagnose infection nor to guide or monitor treatment. Performed at [facility], [place] [address], [place], [place] [address]       Studies:  Dg Chest Port 1 View  Result Date: 06/27/2019 CLINICAL DATA:  Chest tightness. EXAM: PORTABLE CHEST 1 VIEW COMPARISON:  CT chest 06/22/2019 FINDINGS: Mild right lower lobe atelectasis. No definite pneumonia. Negative for heart failure or effusion. No mass lesion IMPRESSION: Mild right lower lobe atelectasis. Electronically Signed   By: Franchot Gallo M.D.   On: 06/27/2019 10:15    Assessment: 85 y.o. [place] woman, without significant past medical history, presented with worsening epigastric pain for 5 days, along slightly decreased appetite, fatigue, and 7 pound weight loss in the past few months and left breast masses   1. Gall bladder cancer  with lymph node metastasis, status post cholecystectomy and lymph node biopsy 2. Left breast masses (2), likely breast cancer 3. abdominal pain second to #1 and surgery    Plan:  -I discussed her gall bladder cancer diagnosis with pt today, but did not get into details especially she still has residual node metastasis which may not be resectable. Will discuss in next GI tumor board,  hepatobiliary pancreatic surgeon Dr. Barry Dienes is aware of the case -Pt states that she does not want more surgery, and asked me how much time she has. She is very grateful for our care. Her husband passed away a few years and she is still in grief, and I got the sense that she does not want to became a burden to her daughter and her family. I expressed my emotional support and encourage her to focus on her postop recovery, and God will guild her (she is a [place]).  -I discussed with her daughter separately before her visit, both she and her brother also does not feel she can tolerate more surgery.  -Discharge per surgery and primary team, she is eager to go home.  -she does not want to have lab daily, I have reviewed her lab, will cancel morning lab  -please prescribe a few weeks of pain med Norco on discharge.  -I will set up her f/u in my office in 1-2 weeks after discharge.   Truitt Merle, MD 06/28/2019

## 2019-06-28 NOTE — Progress Notes (Signed)
Physical Therapy Treatment Patient Details Name: JAYDON SOROKA MRN: [id number] DOB: [birth year] Today's Date: 06/28/2019    History of Present Illness 85 year old Mandarin speaking female with no significant past medical history presents with epigastric pain. CT abdomen pelvis revealed distended gallbladder with gallbladder wall thickening raising concern for acute cholecystitis, multiple masses in the left breast tissue, mass adjacent to pancreatic body and head.  Pt underwent cholecystectomy, excisional biopsy lymph node, Dr. Johney Maine 06/24/19.    PT Comments    Patient is progressing well with mobility and daughter was present to act as Sales executive. Pt abe to don pants today in sitting and standing with min guard and no overt LOB. She required cues initially for safe hand placement on RW and safe proximity to walker during ambulation but maintained safe use throughout session. She continues to have a slow gait and took 3 standing rest breaks during ambulation in hall. Patient and daughter educated on benefits of mobility and instructed to ambulate at least 3x/daily with nursing staff. She will benefit from continued skilled PT interventions to progress mobility as able.    Follow Up Recommendations  Home health PT;Supervision for mobility/OOB     Equipment Recommendations  Rolling walker with 5" wheels    Recommendations for Other Services       Precautions / Restrictions Precautions Precautions: Fall Precaution Comments: R JP drain Restrictions Weight Bearing Restrictions: No    Mobility  Bed Mobility        General bed mobility comments: pt in chair at start of session  Transfers Overall transfer level: Needs assistance Equipment used: Rolling walker (2 wheeled) Transfers: Sit to/from [facility] Sit to Stand: Min guard Stand pivot transfers: Min guard       General transfer comment: verbal/visual cues for safe use of RW during transfers, 1x sit to  tand fro recliner, 1x stand pivot from toilet  Ambulation/Gait Ambulation/Gait assistance: Min guard Gait Distance (Feet): 200 Feet Assistive device: Rolling walker (2 wheeled) Gait Pattern/deviations: Step-through pattern;Decreased stride length;Trunk flexed Gait velocity: decr   General Gait Details: verbal/visual cues for safe proximety to RW and safe hand placement initially and pt maintained safe use throughout mobility   Stairs             Wheelchair Mobility    Modified Rankin (Stroke Patients Only)       Balance Overall balance assessment: Needs assistance   Sitting balance-Leahy Scale: Good Sitting balance - Comments: pt able to begin donning pants seated in recliner   Standing balance support: Bilateral upper extremity supported;No upper extremity supported;During functional activity Standing balance-Leahy Scale: Good Standing balance comment: pt able to pull pants up to fully don in standing without UE support, UE support required for gait           Cognition Arousal/Alertness: Awake/alert Behavior During Therapy: WFL for tasks assessed/performed Overall Cognitive Status: Within Functional Limits for tasks assessed                  Pertinent Vitals/Pain Pain Assessment: Faces Faces Pain Scale: Hurts a little bit Pain Location: abdomen Pain Descriptors / Indicators: Guarding Pain Intervention(s): Limited activity within patient's tolerance;Monitored during session;Premedicated before session           PT Goals (current goals can now be found in the care plan section) Acute Rehab PT Goals PT Goal Formulation: With patient Time For Goal Achievement: 07/10/19 Potential to Achieve Goals: Good Progress towards PT goals: Progressing toward goals  Frequency    Min 3X/week      PT Plan Current plan remains appropriate       AM-PAC PT "6 Clicks" Mobility   Outcome Measure  Help needed turning from your back to your side while in a  flat bed without using bedrails?: A Lot Help needed moving from lying on your back to sitting on the side of a flat bed without using bedrails?: A Lot Help needed moving to and from a bed to a chair (including a wheelchair)?: A Little Help needed standing up from a chair using your arms (e.g., wheelchair or bedside chair)?: A Little Help needed to walk in hospital room?: A Little Help needed climbing 3-5 steps with a railing? : A Lot 6 Click Score: 15    End of Session Equipment Utilized During Treatment: Gait belt Activity Tolerance: Patient tolerated treatment well Patient left: with family/visitor present;with call bell/phone within reach;in chair(seated on bench in room with daughter assisting to dress upper body) Nurse Communication: Mobility status PT Visit Diagnosis: Other abnormalities of gait and mobility (R26.89)     Time: 3567-0141 PT Time Calculation (min) (ACUTE ONLY): 29 min  Charges:  $Gait Training: 8-22 mins $Therapeutic Activity: 8-22 mins                     Kipp Brood, PT, DPT, [facility] Physical Therapist with [facility]  06/28/2019 10:28 AM

## 2019-06-28 NOTE — Progress Notes (Signed)
Patient's daughter states patient said she was in pain.  RN brought pain pills in and patient shakes head no. Daughter asks could I leave pills, RN educated patient that narcotics cant be left in room. Wasted with Colletta Maryland, RN. Patient's daughter also asks for sleep aid med and constipation med. On call provider states she doesn't fell comfortable giving anything for constipation. Benadryl ordered and given. Will continue to monitor.

## 2019-06-28 NOTE — Progress Notes (Addendum)
[facility]/Trauma Progress Note  4 Days Post-Op   Assessment/Plan Pancreatic lymphadenopathy Left breast mass - oncology following  Gallbladder distension and sludge, RUQ pain- chronic cholecystitis -S/P lap chole, LOA, excisional biopsy porta hepatis lymph nodes, Dr. 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  Dr. Burr Medico has asked Dr. Barry Dienes to review scan for possible resection - ambulate, PT rec HH, IS ABLA - Hgb  8.6, stable, monitor hgb and drain output  FEN:HH diet VTE: SCD's, lovenox DV:VOHYW 07/31-08/05 WBC 9.1 Foley:none Follow up:Dr. Barry Dienes, [facility] 2 weeks for possible drain removal  DISPO:pt needs to ambulate.Monitor Hgb and drain output. Pain control.  Drain needs to stay in for 10 to 20 days.  Patient is doing well from her gallbladder surgery.  Should be okay for discharge from our standpoint within the next day or 2.   LOS: 7 days    Subjective: CC: Abdominal pain  Daughter assisted with translation.  Daughter states patient feels much better after pain medicine this morning and is about to ambulate with PT.  She states no nausea or vomiting.  She is having flatus but no BM.  Overall she feels that her mother is doing better and is in better spirits today.  She asked me multiple questions about the diagnosis of gallbladder cancer.  I have also spoken to Dr. Burr Medico about this.  Dr. Burr Medico has stated that I can defer these questions to her.    Objective: Vital signs in last 24 hours: Temp:  [98.4 F (36.9 C)-98.7 F (37.1 C)] 98.4 F (36.9 C) (08/07 0749) Pulse Rate:  [74-79] 74 (08/07 0749) Resp:  [14-19] 18 (08/07 0749) BP: (97-124)/(62-76) 108/62 (08/07 0749) SpO2:  [94 %-97 %] 94 % (08/07 7371) Weight:  [62.1 kg] 62.1 kg (08/07 0749) Last BM Date: 06/27/19  Intake/Output from previous day: 08/06 0701 -  08/07 0700 In: 480 [P.O.:480] Out: 125 [Drains:125] Intake/Output this shift: No intake/output data recorded.  PE: Gen: Alert, NAD, pleasant, cooperative Pulm:Rate andeffort normal Abd: Soft, ND, +BS, incisions appear well and are without signs of infection, drain with minimalsanguinous drainage,mild TTP RUQand epigastric port sitearea withoutguarding. No peritonitis Skin: no rashes noted, warm and dry   Anti-infectives: Anti-infectives (From admission, onward)   Start     Dose/Rate Route Frequency Ordered Stop   06/25/19 0600  ceFAZolin (ANCEF) IVPB 2g/100 mL premix  Status:  Discontinued     2 g 200 mL/hr over 30 Minutes Intravenous On call to O.R. 06/24/19 1324 06/24/19 1402   06/25/19 0600  metroNIDAZOLE (FLAGYL) IVPB 500 mg  Status:  Discontinued     500 mg 100 mL/hr over 60 Minutes Intravenous On call to O.R. 06/24/19 1324 06/24/19 1402   06/24/19 2200  piperacillin-tazobactam (ZOSYN) IVPB 3.375 g     3.375 g 12.5 mL/hr over 240 Minutes Intravenous Every 8 hours 06/24/19 2004 06/26/19 0927   06/24/19 1415  ceFAZolin (ANCEF) IVPB 2g/100 mL premix     2 g 200 mL/hr over 30 Minutes Intravenous On call to O.R. 06/24/19 1402 06/24/19 1507   06/24/19 1415  metroNIDAZOLE (FLAGYL) IVPB 500 mg     500 mg 100 mL/hr over 60 Minutes Intravenous On call to O.R. 06/24/19 1402 06/24/19 1528   06/22/19 0600  piperacillin-tazobactam (ZOSYN) IVPB 3.375 g  Status:  Discontinued     3.375 g 12.5 mL/hr over 240 Minutes Intravenous Every  8 hours 06/22/19 0527 06/22/19 1211   06/21/19 2315  piperacillin-tazobactam (ZOSYN) IVPB 3.375 g     3.375 g 100 mL/hr over 30 Minutes Intravenous  Once 06/21/19 2306 06/22/19 0014      Lab Results:  Recent Labs    06/27/19 0516 06/28/19 0443  WBC 8.2 7.9  HGB 9.0* 8.6*  HCT 28.2* 27.6*  PLT 233 253   BMET Recent Labs    06/27/19 0516 06/28/19 0443  NA 139 137  K 3.4* 3.9  CL 104 104  CO2 28 23  GLUCOSE 134* 110*  BUN 9 10   CREATININE 0.79 0.66  CALCIUM 8.3* 8.7*   PT/INR No results for input(s): LABPROT, INR in the last 72 hours. CMP     Component Value Date/Time   NA 137 06/28/2019 0443   K 3.9 06/28/2019 0443   CL 104 06/28/2019 0443   CO2 23 06/28/2019 0443   GLUCOSE 110 (H) 06/28/2019 0443   BUN 10 06/28/2019 0443   CREATININE 0.66 06/28/2019 0443   CALCIUM 8.7 (L) 06/28/2019 0443   PROT 6.4 (L) 06/28/2019 0443   ALBUMIN 2.9 (L) 06/28/2019 0443   AST 75 (H) 06/28/2019 0443   ALT 45 (H) 06/28/2019 0443   ALKPHOS 60 06/28/2019 0443   BILITOT 0.4 06/28/2019 0443   GFRNONAA >60 06/28/2019 0443   GFRAA >60 06/28/2019 0443   Lipase     Component Value Date/Time   LIPASE 34 06/21/2019 1322    Studies/Results: Dg Chest Port 1 View  Result Date: 06/27/2019 CLINICAL DATA:  Chest tightness. EXAM: PORTABLE CHEST 1 VIEW COMPARISON:  CT chest 06/22/2019 FINDINGS: Mild right lower lobe atelectasis. No definite pneumonia. Negative for heart failure or effusion. No mass lesion IMPRESSION: Mild right lower lobe atelectasis. Electronically Signed   By: Franchot Gallo M.D.   On: 06/27/2019 10:15      Kalman Drape , [facility] 06/28/2019, 9:40 AM  Pager: [phone] Mon-Wed, Friday 7:00am-4:30pm Thurs 7am-11:30am  Consults: [phone]

## 2019-06-29 MED ORDER — BISACODYL 5 MG PO TBEC
10.0000 mg | DELAYED_RELEASE_TABLET | Freq: Once | ORAL | Status: AC
  Administered 2019-06-29: 10 mg via ORAL
  Filled 2019-06-29: qty 2

## 2019-06-29 MED ORDER — HYDROCODONE-ACETAMINOPHEN 5-325 MG PO TABS
1.0000 | ORAL_TABLET | Freq: Three times a day (TID) | ORAL | Status: DC | PRN
  Administered 2019-06-30 – 2019-07-01 (×3): 1 via ORAL
  Filled 2019-06-29 (×3): qty 1

## 2019-06-29 NOTE — Progress Notes (Signed)
PT Cancellation Note  Patient Details Name: Rachel Hughes MRN: [id number] DOB: 03-Nov-1933   Cancelled Treatment:    Reason Eval/Treat Not Completed: Other (comment) Daughter reports pt very weak and not feeling well this morning.  Pt feeling better this afternoon however sleeping.  Daughter not agreeable for pt to mobilize at this time despite reminding her of benefits of mobilizing.  Daughter stating her mother needed to rest.   Axel Frisk,KATHrine E 06/29/2019, 2:39 PM Carmelia Bake, PT, DPT Acute Rehabilitation Services Office: [phone] Pager: [phone]

## 2019-06-29 NOTE — Progress Notes (Addendum)
OT Cancellation Note  Patient Details Name: YURIDIA COUTS MRN: [id number] DOB: September 03, 1933   Cancelled Treatment:    Reason Eval/Treat Not Completed: Patient declined, no reason specified Pt reports weakness and inability to get up. Daughter present during evaluation attempt without mask on and attempting to translate.   Daughter reports at current level the family can not manage patient. Daughter states "she can not leave for 3 to 4 days now".   Richelle Ito, OTR/L  Acute Rehabilitation Services Pager: [phone] Office: [phone] .  06/29/2019, 12:50 PM

## 2019-06-29 NOTE — Progress Notes (Signed)
PROGRESS NOTE  Rachel Hughes VOZ:[id number] DOB: July 26, 1933 DOA: 06/21/2019 PCP: Vivi Barrack, MD   LOS: 8 days   Brief narrative: Rachel Hughes a57 year old Mandarin speaking female with no significant past medical history presented to the hospital with epigastric pain  for 5 days. She had not seen a physician since 2018. She had never had a mammogram to her knowledge. She states that she has had some intermittent abdominal pain in the past, always resolved at home but this time it has persisted which prompted her to seek care in the hospital. Had no other complaints on admission. In the ED, CT abdomen pelvis revealed distended gallbladder with gallbladder wall thickening raising concern for acute cholecystitis, multiple masses in the left breast tissue, mass adjacent to pancreatic body and head.  Patient was admitted under hospitalist service for further evaluation and management.    Subjective: Patient was seen and examined this morning.  Mandarin speaking female.  Unable to directly communicate.  Daughter at bedside.  Daughter is concerned that patient got too much pain medicine this morning and is lethargic because of that.  Daughter states that she was able to walk on the hallway yesterday and today she required a lot of assistance even to go to the bathroom.  There was a tentative plan for discharge today.  Daughter wants to wait another day because of this new lethargy. She was also noted to have constantly fluctuating heart rate between 30s to 90s.  EKG showed Mobitz type II heart block.  Assessment/Plan:  Principal Problem:   Gallbladder cancer with LN  metastasis s/p lap cholecyestectomy & LN biopsy 06/24/2019 Active Problems:   Abdominal pain   Gallbladder sludge   Pancreatic mass   Mass of breast, left   Non-English speaking patient (Mandarin)  Gall bladder cancer with lymph node metastasis, status post cholecystectomy and lymph node biopsy -Initially presented as right  upper quadrant abdominal pain. RUQ US showed large amount of sludge within gallbladder with borderline gallbladder wall thickness, no visible stones.  Patient was seen by general surgery and underwent cholecystectomy on 8/3. Excisional biopsy of lymph node showed likely primary gallbladder malignancy with lymphadenopathy. Surgery  recommends continuation of drain for 10 to 20 days.  New Left breast mass x2 likely breast cancer, peri-pancreatic lymphadenopathy  -CT abdomen pelvis revealed distended gallbladder with gallbladder wall thickening.  Multiple masses in the left breast tissue.  3.2 cm and 2.5 cm masses adjacent to the pancreatic body and head. CT chest revealed 2 adjacent rounded soft tissue masses in the left breast suspicious for breast cancer, no CT findings of metastatic disease involving chest/lung, no lytic or sclerotic bone lesion to suggest osseous metastatic disease -GI and IR reviewed the patient's imaging, patient's pancreas appeared normal. IR recommended outpatient mammogram, ultrasound guided biopsy of left breast mass.  Patient has been seen by Dr. Burr Medico oncology as well.Dr. Paulita Fujita, GI, reviewed case and does not feel EUS indicated.   Patient was seen by oncology Dr. Pilar Plate.  Stated that she does not want to go for any other surgery.  Patient was tentatively for discharge to home today with pain medicines. Holding discharge now because of new lethargy.  Impaired mobility  -Daughter states that patient has remained very lethargic this morning after she was given pain pills.  Will discharge today.  Monitor clinically.  I reduced the dose and frequency of as needed Norco.  Avoid any other sedatives.  Mobitz type II heart block -Asymptomatic.  Noted to  have variable rate in telemetry.  Not on a beta-blocker or calcium channel blocker.  Blood work from this morning shows potassium 3.9 and magnesium of 2.1.  Daughter is not interested in any aggressive intervention like pacemaker  placement.  Mild cough and upper abdominal tightness.  Improved.  X-ray of the chest shows mild right lower lobe atelectasis.  Incentive and spirometry ambulation encouraged including deep breathing.  Mobility: Encourage ambulation.  PT recommended rolling walker. Diet: Start healthy diet DVT prophylaxis:  SCDs Code Status:   Code Status: Full Code  Family Communication:  Expected Discharge:  Home likely tomorrow  Consultants:  GI, general surgery, oncology  Antimicrobials: Anti-infectives (From admission, onward)   Start     Dose/Rate Route Frequency Ordered Stop   06/25/19 0600  ceFAZolin (ANCEF) IVPB 2g/100 mL premix  Status:  Discontinued     2 g 200 mL/hr over 30 Minutes Intravenous On call to O.R. 06/24/19 1324 06/24/19 1402   06/25/19 0600  metroNIDAZOLE (FLAGYL) IVPB 500 mg  Status:  Discontinued     500 mg 100 mL/hr over 60 Minutes Intravenous On call to O.R. 06/24/19 1324 06/24/19 1402   06/24/19 2200  piperacillin-tazobactam (ZOSYN) IVPB 3.375 g     3.375 g 12.5 mL/hr over 240 Minutes Intravenous Every 8 hours 06/24/19 2004 06/26/19 0927   06/24/19 1415  ceFAZolin (ANCEF) IVPB 2g/100 mL premix     2 g 200 mL/hr over 30 Minutes Intravenous On call to O.R. 06/24/19 1402 06/24/19 1507   06/24/19 1415  metroNIDAZOLE (FLAGYL) IVPB 500 mg     500 mg 100 mL/hr over 60 Minutes Intravenous On call to O.R. 06/24/19 1402 06/24/19 1528   06/22/19 0600  piperacillin-tazobactam (ZOSYN) IVPB 3.375 g  Status:  Discontinued     3.375 g 12.5 mL/hr over 240 Minutes Intravenous Every 8 hours 06/22/19 0527 06/22/19 1211   06/21/19 2315  piperacillin-tazobactam (ZOSYN) IVPB 3.375 g     3.375 g 100 mL/hr over 30 Minutes Intravenous  Once 06/21/19 2306 06/22/19 0014      Infusions:    Scheduled Meds:  acetaminophen  500 mg Oral TID   Chlorhexidine Gluconate Cloth  6 each Topical Once   lip balm  1 application Topical BID   multivitamin with minerals  1 tablet Oral Daily    pantoprazole  40 mg Oral Daily   polyethylene glycol  17 g Oral Daily    PRN meds: HYDROcodone-acetaminophen, magic mouthwash, ondansetron (ZOFRAN) IV   Objective: Vitals:   06/29/19 0925 06/29/19 1357  BP: (!) 98/52 97/63  Pulse: 90 77  Resp: 20 16  Temp: 98.4 F (36.9 C) 98.5 F (36.9 C)  SpO2: 94% 96%    Intake/Output Summary (Last 24 hours) at 06/29/2019 1443 Last data filed at 06/29/2019 0100 Gross per 24 hour  Intake 240 ml  Output 65 ml  Net 175 ml   Filed Weights   06/28/19 0500 06/28/19 0749 06/29/19 0500  Weight: 62.1 kg 62.1 kg 57.4 kg   Weight change: 0 kg Body mass index is 24.72 kg/m.   Physical Exam: General exam: Appears drowsy.  Not in distress. Skin: No rashes, lesions or ulcers. HEENT: Atraumatic, normocephalic, supple neck, no obvious bleeding Lungs: Clear to auscultation bilaterally CVS: Regular rate and rhythm, no obvious type II block on EKG GI/Abd soft, nontender, nondistended, bowel sound present CNS: Drowsy, opens eyes on verbal command, communicates through her daughter because of language barrier. Psychiatry: Appropriate mood Extremities: No pedal edema, no  calf tenderness  Data Review: I have personally reviewed the laboratory data and studies available.  Recent Labs  Lab 06/25/19 0510 06/25/19 1346 06/26/19 0522 06/27/19 0516 06/28/19 0443  WBC 9.1 12.3* 9.1 8.2 7.9  HGB 9.8* 9.6* 8.9* 9.0* 8.6*  HCT 30.9* 29.9* 27.7* 28.2* 27.6*  MCV 95.4 93.7 94.9 94.9 95.2  PLT 241 241 204 233 253   Recent Labs  Lab 06/24/19 0537 06/25/19 0510 06/26/19 0522 06/27/19 0516 06/28/19 0443  NA 139 136 137 139 137  K 3.4* 4.7 3.6 3.4* 3.9  CL 104 106 105 104 104  CO2 24 24 26 28 23   GLUCOSE 114* 157* 118* 134* 110*  BUN 14 14 10 9 10   CREATININE 0.70 0.81 0.86 0.79 0.66  CALCIUM 8.7* 8.2* 8.1* 8.3* 8.7*  MG  --  2.2  --  2.4 2.1    Terrilee Croak, MD  [facility] 06/29/2019

## 2019-06-29 NOTE — Progress Notes (Signed)
RN called to room. Daughter states pt feels extremely weak. States she could barely sit up and eat breakfast. VS obtained. MD notified. Orders for EKG. Will continue to monitor.

## 2019-06-29 NOTE — Progress Notes (Signed)
[place] [facility] Office:  [phone] General Surgery Progress Note   LOS: 8 days  POD -  5 Days Post-Op  Chief Complaint: Abdominal pain  Assessment and Plan: 1. LAPAROSCOPIC CHOLECYSTECTOMY WITH INTRAOPERATIVE CHOLANGIOGRAM WITH LYMPH NODE BIOPSY- 06/24/2019 - Gross  Path - 4.3 cm poorly differentiated gall bladder cancer, 1/1 node (T3, N1)  Seen by Dr. Burr Medico  From surgery standpoint, she can go home.  But this AM she is very weak.  She says that she cannot get out of the bed. Daughter at bedside.  2.  Left breast mass, possible cancer 3.  Anemia  Hgb - 8.6 - 06/28/2019 4.  Constipation  As least part of this is chronic 5.  DVT prophylaxis - on no chemoprophylaxis.  Since it looks like she is not going home, will start Lovenox   Principal Problem:   Gallbladder cancer with LN  metastasis s/p lap cholecyestectomy & LN biopsy 06/24/2019 Active Problems:   Abdominal pain   Gallbladder sludge   Pancreatic mass   Mass of breast, left   Non-English speaking patient (Mandarin)   Subjective:  Says that she is too weak to get out of bed.  Has no specific location of pain.  Speaks Mandarin.  Her daughter is in the room.  Objective:   Vitals:   06/29/19 0557 06/29/19 0925  BP: 112/68 (!) 98/52  Pulse: 63 90  Resp: 17 20  Temp: 98.7 F (37.1 C) 98.4 F (36.9 C)  SpO2: 95% 94%     Intake/Output from previous day:  08/07 0701 - 08/08 0700 In: 720 [P.O.:720] Out: 190 [Drains:190]  Intake/Output this shift:  No intake/output data recorded.   Physical Exam:   General: Asian F who is alert and oriented.    HEENT: Normal. Pupils equal. .   Lungs: Clear   Abdomen:  Soft.  BS present   Wound: Clean.  Drain in RUQ - 190 cc recorded.   Lab Results:    Recent Labs    06/27/19 0516 06/28/19 0443  WBC 8.2 7.9  HGB 9.0* 8.6*  HCT 28.2* 27.6*  PLT 233 253    BMET   Recent Labs    06/27/19 0516 06/28/19 0443  NA 139 137  K 3.4* 3.9  CL 104 104  CO2 28 23   GLUCOSE 134* 110*  BUN 9 10  CREATININE 0.79 0.66  CALCIUM 8.3* 8.7*    PT/INR  No results for input(s): LABPROT, INR in the last 72 hours.  ABG  No results for input(s): PHART, HCO3 in the last 72 hours.  Invalid input(s): PCO2, PO2   Studies/Results:  No results found.   Anti-infectives:   Anti-infectives (From admission, onward)   Start     Dose/Rate Route Frequency Ordered Stop   06/25/19 0600  ceFAZolin (ANCEF) IVPB 2g/100 mL premix  Status:  Discontinued     2 g 200 mL/hr over 30 Minutes Intravenous On call to O.R. 06/24/19 1324 06/24/19 1402   06/25/19 0600  metroNIDAZOLE (FLAGYL) IVPB 500 mg  Status:  Discontinued     500 mg 100 mL/hr over 60 Minutes Intravenous On call to O.R. 06/24/19 1324 06/24/19 1402   06/24/19 2200  piperacillin-tazobactam (ZOSYN) IVPB 3.375 g     3.375 g 12.5 mL/hr over 240 Minutes Intravenous Every 8 hours 06/24/19 2004 06/26/19 0927   06/24/19 1415  ceFAZolin (ANCEF) IVPB 2g/100 mL premix     2 g 200 mL/hr over 30 Minutes Intravenous On call to O.R.  06/24/19 1402 06/24/19 1507   06/24/19 1415  metroNIDAZOLE (FLAGYL) IVPB 500 mg     500 mg 100 mL/hr over 60 Minutes Intravenous On call to O.R. 06/24/19 1402 06/24/19 1528   06/22/19 0600  piperacillin-tazobactam (ZOSYN) IVPB 3.375 g  Status:  Discontinued     3.375 g 12.5 mL/hr over 240 Minutes Intravenous Every 8 hours 06/22/19 0527 06/22/19 1211   06/21/19 2315  piperacillin-tazobactam (ZOSYN) IVPB 3.375 g     3.375 g 100 mL/hr over 30 Minutes Intravenous  Once 06/21/19 2306 06/22/19 0014      Alphonsa Overall, MD, FACS Pager: [facility] Office: [phone] 06/29/2019

## 2019-06-30 MED ORDER — LIDOCAINE-PRILOCAINE 2.5-2.5 % EX CREA
TOPICAL_CREAM | Freq: Once | CUTANEOUS | Status: AC
  Administered 2019-06-30: 13:00:00 via TOPICAL
  Filled 2019-06-30: qty 5

## 2019-06-30 NOTE — Progress Notes (Signed)
[facility] Office:  [phone] General Surgery Progress Note   LOS: 9 days  POD -  6 Days Post-Op  Chief Complaint: Abdominal pain  Assessment and Plan: 1. LAPAROSCOPIC CHOLECYSTECTOMY WITH INTRAOPERATIVE CHOLANGIOGRAM WITH LYMPH NODE BIOPSY- 06/24/2019 - Gross  Path - 4.3 cm poorly differentiated gall bladder cancer, 1/1 node (T3, N1)  Seen by Dr. Burr Medico  From surgery standpoint, she can go home.  But this AM she is very weak.  She says that she cannot get out of the bed. Daughter at bedside.  2.  Left breast mass, possible cancer 3.  Anemia  Hgb - 8.6 - 06/28/2019 4.  Constipation  As least part of this is chronic 5.  DVT prophylaxis - on no chemoprophylaxis.  Since it looks like she is not going home, will start Lovenox   Principal Problem:   Gallbladder cancer with LN  metastasis s/p lap cholecyestectomy & LN biopsy 06/24/2019 Active Problems:   Abdominal pain   Gallbladder sludge   Pancreatic mass   Mass of breast, left   Non-English speaking patient (Mandarin)   Subjective:  Says that she is too weak to get out of bed.  Has no specific location of pain.  Speaks Mandarin.  Her daughter is in the room.  Objective:   Vitals:   06/29/19 2024 06/30/19 0638  BP: 113/62 97/67  Pulse: 69 78  Resp: 16 16  Temp: 97.9 F (36.6 C) (!) 97.5 F (36.4 C)  SpO2: 96% 96%     Intake/Output from previous day:  08/08 0701 - 08/09 0700 In: -  Out: 103 [Drains:103]  Intake/Output this shift:  No intake/output data recorded.   Physical Exam:   General: Asian F who is alert and oriented.    HEENT: Normal. Pupils equal. .   Lungs: Clear   Abdomen:  Soft.  BS present   Wound: Clean.  Drain in RUQ - 190 cc recorded.   Lab Results:    Recent Labs    06/28/19 0443  WBC 7.9  HGB 8.6*  HCT 27.6*  PLT 253    BMET   Recent Labs    06/28/19 0443  NA 137  K 3.9  CL 104  CO2 23  GLUCOSE 110*  BUN 10  CREATININE 0.66  CALCIUM 8.7*    PT/INR  No  results for input(s): LABPROT, INR in the last 72 hours.  ABG  No results for input(s): PHART, HCO3 in the last 72 hours.  Invalid input(s): PCO2, PO2   Studies/Results:  No results found.   Anti-infectives:   Anti-infectives (From admission, onward)   Start     Dose/Rate Route Frequency Ordered Stop   06/25/19 0600  ceFAZolin (ANCEF) IVPB 2g/100 mL premix  Status:  Discontinued     2 g 200 mL/hr over 30 Minutes Intravenous On call to O.R. 06/24/19 1324 06/24/19 1402   06/25/19 0600  metroNIDAZOLE (FLAGYL) IVPB 500 mg  Status:  Discontinued     500 mg 100 mL/hr over 60 Minutes Intravenous On call to O.R. 06/24/19 1324 06/24/19 1402   06/24/19 2200  piperacillin-tazobactam (ZOSYN) IVPB 3.375 g     3.375 g 12.5 mL/hr over 240 Minutes Intravenous Every 8 hours 06/24/19 2004 06/26/19 0927   06/24/19 1415  ceFAZolin (ANCEF) IVPB 2g/100 mL premix     2 g 200 mL/hr over 30 Minutes Intravenous On call to O.R. 06/24/19 1402 06/24/19 1507   06/24/19 1415  metroNIDAZOLE (FLAGYL) IVPB 500 mg  500 mg 100 mL/hr over 60 Minutes Intravenous On call to O.R. 06/24/19 1402 06/24/19 1528   06/22/19 0600  piperacillin-tazobactam (ZOSYN) IVPB 3.375 g  Status:  Discontinued     3.375 g 12.5 mL/hr over 240 Minutes Intravenous Every 8 hours 06/22/19 0527 06/22/19 1211   06/21/19 2315  piperacillin-tazobactam (ZOSYN) IVPB 3.375 g     3.375 g 100 mL/hr over 30 Minutes Intravenous  Once 06/21/19 2306 48/27/07 8675      Rachel [facility] C Dede Dobesh, MD  [facility]

## 2019-06-30 NOTE — Progress Notes (Signed)
PROGRESS NOTE  Rachel Hughes GBT:[id number] DOB: [birth year] DOA: 06/21/2019 PCP: Vivi Barrack, MD   LOS: 9 days   Brief narrative: Rachel Hughes a29 year old Mandarin speaking female with no significant past medical history who presented to the hospital on 7/31 with epigastric pain for 5 days. She had not seen a physician since 2018. She had never had a mammogram to her knowledge. She states that she has had some intermittent abdominal pain in the past, always resolved at home but this time it persisted which prompted her to seek care in the hospital. Had no other complaints on admission. In the ED, CT abdomen pelvis revealed distended gallbladder with gallbladder wall thickening raising concern for acute cholecystitis, multiple masses in the left breast tissue, mass adjacent to pancreatic body and head.  Patient was admitted under hospitalist service for further evaluation and management.    RUQ US showed large amount of sludge within gallbladder with borderline gallbladder wall thickness, no visible stones.  Patient was seen by general surgery and underwent cholecystectomy on 8/3. Excisional biopsy of lymph node showed likely primary gallbladder malignancy with lymphadenopathy.  She was seen by oncologist Dr. Burr Medico.  Patient refused any further intervention or surgery.  Subjective: Patient was seen and examined this morning.  Mandarin speaking female.  Unable to directly communicate.  Daughter at bedside.  Patient was lethargic yesterday morning, likely from the effect of pain medicines.  Patient was reluctant to take any pain medicine in last 24 hours.  Her lethargy improved but she remained in pain most of the time.  This morning, patient is awake, alert, sitting up in chair.  Per RN, she required 3 people assistance to use the commode.   I had a conversation with Dr. Burr Medico about her this morning.  Patient does not have insurance.  Does not qualify for hospice care at home.     Assessment/Plan:  Principal Problem:   Gallbladder cancer with LN  metastasis s/p lap cholecyestectomy & LN biopsy 06/24/2019 Active Problems:   Abdominal pain   Gallbladder sludge   Pancreatic mass   Mass of breast, left   Non-English speaking patient (Mandarin)  Gall bladder cancer with lymph node metastasis, status post cholecystectomy and lymph node biopsy -Initially presented as right upper quadrant abdominal pain. RUQ US showed large amount of sludge within gallbladder with borderline gallbladder wall thickness, no visible stones.  Patient was seen by general surgery and underwent cholecystectomy on 8/3. Excisional biopsy of lymph node showed likely primary gallbladder malignancy with lymphadenopathy. Surgery recommends continuation of drain for 10 to 20 days.  New Left breast mass x2 likely breast cancer, peri-pancreatic lymphadenopathy  -CT abdomen pelvis revealed distended gallbladder with gallbladder wall thickening.  Multiple masses in the left breast tissue, 3.2 cm and 2.5 cm masses adjacent to the pancreatic body and head. CT chest revealed 2 adjacent rounded soft tissue masses in the left breast suspicious for breast cancer, no CT findings of metastatic disease involving chest/lung, no lytic or sclerotic bone lesion to suggest osseous metastatic disease -GI and IR reviewed the patient's imaging, patient's pancreas appeared normal. IR recommended outpatient mammogram, ultrasound guided biopsy of left breast mass.  Patient has been seen by Dr. Burr Medico oncology as well.Dr. Paulita Fujita, GI, reviewed case and does not feel EUS indicated. patient does not want to go for any other surgery.    Impaired mobility  -Required 3 people assistance today to use commode.  PT reevaluation pending.    Mobitz type II  heart block -Asymptomatic.  Noted to have variable rate in telemetry.  Not on a beta-blocker or calcium channel blocker.  Last blood work from 8/8 showed potassium 3.9 and magnesium of 2.1.   Daughter is not interested in any aggressive intervention like pacemaker placement.  Mild cough and upper abdominal tightness.  Improved.  X-ray of the chest shows mild right lower lobe atelectasis.  Incentive and spirometry ambulation encouraged including deep breathing.  Mobility: Encourage ambulation.  PT recommended rolling walker. Diet: Start healthy diet DVT prophylaxis:  SCDs Code Status:   Code Status: Full Code  Family Communication:  Expected Discharge:  Home likely this afternoon versus tomorrow.  Consultants:  GI, general surgery, oncology  Antimicrobials: Anti-infectives (From admission, onward)   Start     Dose/Rate Route Frequency Ordered Stop   06/25/19 0600  ceFAZolin (ANCEF) IVPB 2g/100 mL premix  Status:  Discontinued     2 g 200 mL/hr over 30 Minutes Intravenous On call to O.R. 06/24/19 1324 06/24/19 1402   06/25/19 0600  metroNIDAZOLE (FLAGYL) IVPB 500 mg  Status:  Discontinued     500 mg 100 mL/hr over 60 Minutes Intravenous On call to O.R. 06/24/19 1324 06/24/19 1402   06/24/19 2200  piperacillin-tazobactam (ZOSYN) IVPB 3.375 g     3.375 g 12.5 mL/hr over 240 Minutes Intravenous Every 8 hours 06/24/19 2004 06/26/19 0927   06/24/19 1415  ceFAZolin (ANCEF) IVPB 2g/100 mL premix     2 g 200 mL/hr over 30 Minutes Intravenous On call to O.R. 06/24/19 1402 06/24/19 1507   06/24/19 1415  metroNIDAZOLE (FLAGYL) IVPB 500 mg     500 mg 100 mL/hr over 60 Minutes Intravenous On call to O.R. 06/24/19 1402 06/24/19 1528   06/22/19 0600  piperacillin-tazobactam (ZOSYN) IVPB 3.375 g  Status:  Discontinued     3.375 g 12.5 mL/hr over 240 Minutes Intravenous Every 8 hours 06/22/19 0527 06/22/19 1211   06/21/19 2315  piperacillin-tazobactam (ZOSYN) IVPB 3.375 g     3.375 g 100 mL/hr over 30 Minutes Intravenous  Once 06/21/19 2306 06/22/19 0014      Infusions:    Scheduled Meds: . acetaminophen  500 mg Oral TID  . Chlorhexidine Gluconate Cloth  6 each Topical Once   . lip balm  1 application Topical BID  . multivitamin with minerals  1 tablet Oral Daily  . pantoprazole  40 mg Oral Daily  . polyethylene glycol  17 g Oral Daily    PRN meds: HYDROcodone-acetaminophen, magic mouthwash, ondansetron (ZOFRAN) IV   Objective: Vitals:   06/29/19 2024 06/30/19 0638  BP: 113/62 97/67  Pulse: 69 78  Resp: 16 16  Temp: 97.9 F (36.6 C) (!) 97.5 F (36.4 C)  SpO2: 96% 96%    Intake/Output Summary (Last 24 hours) at 06/30/2019 1057 Last data filed at 06/30/2019 0640 Gross per 24 hour  Intake -  Output 103 ml  Net -103 ml   Filed Weights   06/28/19 0500 06/28/19 0749 06/29/19 0500  Weight: 62.1 kg 62.1 kg 57.4 kg   Weight change:  Body mass index is 24.72 kg/m.   Physical Exam: General exam: Sitting up in chair.  Not in distress. Skin: No rashes, lesions or ulcers. HEENT: Atraumatic, normocephalic, supple neck, no obvious bleeding Lungs: Clear to auscultation bilaterally CVS: Regular rate and rhythm, no obvious type II block on EKG GI/Abd soft, nontender, nondistended, bowel sound present CNS: Sitting up in chair.  Not in distress, communicates through her daughter because  of language barrier. Psychiatry: Appropriate mood Extremities: No pedal edema, no calf tenderness  Data Review: I have personally reviewed the laboratory data and studies available.  Recent Labs  Lab 06/25/19 0510 06/25/19 1346 06/26/19 0522 06/27/19 0516 06/28/19 0443  WBC 9.1 12.3* 9.1 8.2 7.9  HGB 9.8* 9.6* 8.9* 9.0* 8.6*  HCT 30.9* 29.9* 27.7* 28.2* 27.6*  MCV 95.4 93.7 94.9 94.9 95.2  PLT 241 241 204 233 253   Recent Labs  Lab 06/24/19 0537 06/25/19 0510 06/26/19 0522 06/27/19 0516 06/28/19 0443  NA 139 136 137 139 137  K 3.4* 4.7 3.6 3.4* 3.9  CL 104 106 105 104 104  CO2 24 24 26 28 23   GLUCOSE 114* 157* 118* 134* 110*  BUN 14 14 10 9 10   CREATININE 0.70 0.81 0.86 0.79 0.66  CALCIUM 8.7* 8.2* 8.1* 8.3* 8.7*  MG  --  2.2  --  2.4 2.1    Terrilee Croak, MD  [facility] 06/30/2019

## 2019-06-30 NOTE — Progress Notes (Signed)
MD--pt's daughter stating that pt is feeling much and they would like to go home. Daughter wanted pt to have [facility] and [facility] at home. Please order on d/c.  Daughter stating they would like to leave after equipment needs are setup tomorrow. THX

## 2019-06-30 NOTE — Evaluation (Signed)
Occupational Therapy Evaluation Patient Details Name: Rachel Hughes MRN: [id number] DOB: [birth year] Today's Date: 06/30/2019    History of Present Illness 85 year old Mandarin speaking female with no significant past medical history presents with epigastric pain. CT abdomen pelvis revealed distended gallbladder with gallbladder wall thickening raising concern for acute cholecystitis, multiple masses in the left breast tissue, mass adjacent to pancreatic body and head.  Pt underwent cholecystectomy, excisional biopsy lymph node, Dr. Johney Maine 06/24/19.   Clinical Impression   Patient is s/p cholecystectomy surgery 06/24/19 resulting in functional limitations due to the deficits listed below (see OT problem list). Pt currently requires min (A) for transfers. Pt and daughter directing session and not accepting education provided by staff. When staff staff attempting to educate states "no she don't like that" Pt reluctant to try and new method for bed mobility. Pt demonstrates reliance   Patient will benefit from skilled OT acutely to increase independence and safety with ADLS to allow discharge home with family.     Follow Up Recommendations  No OT follow up    Equipment Recommendations  None recommended by OT    Recommendations for Other Services       Precautions / Restrictions Precautions Precautions: Fall Precaution Comments: R JP drain Restrictions Weight Bearing Restrictions: No      Mobility Bed Mobility Overal bed mobility: Needs Assistance Bed Mobility: Supine to Sit Rolling: (refusing)   Supine to sit: Max assist     General bed mobility comments: OT educating on rolling toward L side with R leg in flexion to help progress toward EOB. pt states no. Daughter states "you have to lift her with two people" Again OT attempting to educate and education no accepted to progress out of bed. OT using pad to pivot hips to eob and max (A) elevation of trunk at pt request. pt and daughter states  "she says that was painful" OT again attempting to educate that the other method will be less painful and this preferred method that hte patient insist on is not the best for pain management.   Transfers Overall transfer level: Needs assistance Equipment used: 1 person hand held assist Transfers: Sit to/from [facility] Sit to Stand: Min assist Stand pivot transfers: Min assist       General transfer comment: pt insisting on hand held (A) for transfer. pt declining the elevated surface and wanting a lower surface with staff to help lift her with decreased effort for her to power up.     Balance Overall balance assessment: Needs assistance   Sitting balance-Leahy Scale: Good     Standing balance support: Bilateral upper extremity supported;During functional activity Standing balance-Leahy Scale: Poor Standing balance comment: reliant on daughter Or OT during static task. pt resistant to education and not following recommendations                           ADL either performed or assessed with clinical judgement   ADL Overall ADL's : Needs assistance/impaired Eating/Feeding: Minimal assistance Eating/Feeding Details (indicate cue type and reason): daughter assisting patient but patient coudl complete at supervision level Grooming: Supervision/safety Grooming Details (indicate cue type and reason): washing hands with wipes Upper Body Bathing: Moderate assistance   Lower Body Bathing: Maximal assistance   Upper Body Dressing : Moderate assistance   Lower Body Dressing: Maximal assistance   Toilet Transfer: Minimal assistance;BSC Toilet Transfer Details (indicate cue type and reason): pt with BSC elevated by  two buttons to help with sit<Stand and abdominal pain prior to patient movement. pt reports that she does not like it higher and pulling on therapist. Daughter and patient educated by therapist that patient can not pull on OT and becoming upset that  OT will not help lift patient but instead attempting to facilitation power up from surface.  Toileting- Clothing Manipulation and Hygiene: Total assistance Toileting - Clothing Manipulation Details (indicate cue type and reason): pt static standign and then leaning foward on bed surface with hips 90 degree flexion. pt given max cues for upright posture and daughter instead of translating states "she cant she do it like this"       Functional mobility during ADLs: Minimal assistance General ADL Comments: pt transferred from supine to [facility] to chair to eat lunch. pt sitting in chair eating lunch at end of session.      Vision         Perception     Praxis      Pertinent Vitals/Pain Pain Assessment: Faces Faces Pain Scale: Hurts even more Pain Location: abdomen Pain Descriptors / Indicators: Guarding;Grimacing Pain Intervention(s): Premedicated before session;Repositioned     Hand Dominance Right   Extremity/Trunk Assessment Upper Extremity Assessment Upper Extremity Assessment: Generalized weakness   Lower Extremity Assessment Lower Extremity Assessment: Defer to PT evaluation   Cervical / Trunk Assessment Cervical / Trunk Assessment: Other exceptions Cervical / Trunk Exceptions: forward flexed posture due to pain in abdomen   Communication Communication Communication: Prefers language other than English(Mandarian, daughter interpreted)   Cognition Arousal/Alertness: Awake/alert Behavior During Therapy: WFL for tasks assessed/performed Overall Cognitive Status: Within Functional Limits for tasks assessed                                     General Comments  Jp drain intake    Exercises     Shoulder Instructions      Home Living Family/patient expects to be discharged to:: Private residence Living Arrangements: Alone Available Help at Discharge: Family Type of Home: House Home Access: Stairs to enter Technical brewer of Steps: 2   Home  Layout: Able to live on main level with bedroom/bathroom               Home Equipment: Walker - standard   Additional Comments: daughter present and reports pt will d/c to pt's son home as above      Prior Functioning/Environment Level of Independence: Independent                 OT Problem List: Decreased strength;Decreased activity tolerance;Impaired balance (sitting and/or standing);Decreased cognition;Decreased safety awareness;Decreased knowledge of use of DME or AE;Decreased knowledge of precautions;Pain      OT Treatment/Interventions: Self-care/ADL training;Therapeutic exercise;Neuromuscular education;Energy conservation;DME and/or AE instruction;Manual therapy;Therapeutic activities;Balance training;Patient/family education    OT Goals(Current goals can be found in the care plan section) Acute Rehab OT Goals Patient Stated Goal: daughter expressing pain is better than 06/29/19 but needing day still at hospital OT Goal Formulation: With patient/family Time For Goal Achievement: 07/14/19 Potential to Achieve Goals: Good  OT Frequency: Min 2X/week   Barriers to D/C:            Co-evaluation              AM-PAC OT "6 Clicks" Daily Activity     Outcome Measure Help from another person eating meals?: None Help from another person  taking care of personal grooming?: None Help from another person toileting, which includes using toliet, bedpan, or urinal?: A Little Help from another person bathing (including washing, rinsing, drying)?: A Lot Help from another person to put on and taking off regular upper body clothing?: A Little Help from another person to put on and taking off regular lower body clothing?: A Lot 6 Click Score: 18   End of Session Nurse Communication: Mobility status;Precautions  Activity Tolerance: Patient tolerated treatment well Patient left: in chair;with call bell/phone within reach;with family/visitor present  OT Visit Diagnosis:  Unsteadiness on feet (R26.81);Muscle weakness (generalized) (M62.81)                Time: 1165-7903 OT Time Calculation (min): 9 min Charges:  OT General Charges $OT Visit: 1 Visit OT Evaluation $OT Eval Moderate Complexity: 1 Mod   Jeri Modena, OTR/L  Acute Rehabilitation Services Pager: [phone] Office: [phone] .   Jeri Modena 06/30/2019, 11:58 AM

## 2019-07-01 MED ORDER — DULCOLAX 5 MG PO TBEC: 5.0000 mg | DELAYED_RELEASE_TABLET | Freq: Every day | ORAL | 1 refills | Status: AC | PRN

## 2019-07-01 MED ORDER — HYDROCODONE-ACETAMINOPHEN 5-325 MG PO TABS: 1.0000 | ORAL_TABLET | Freq: Four times a day (QID) | ORAL | 0 refills | Status: AC | PRN

## 2019-07-01 MED ORDER — SENNA 8.6 MG PO TABS: 1.0000 | ORAL_TABLET | Freq: Every day | ORAL | 0 refills | Status: AC

## 2019-07-01 NOTE — Progress Notes (Signed)
7 Days Post-Op   Subjective/Chief Complaint: No new changes Pain controlled Discussed with pt's daughter at the bedside   Objective: Vital signs in last 24 hours: Temp:  [98.4 F (36.9 C)-99 F (37.2 C)] 99 F (37.2 C) (08/10 0526) Pulse Rate:  [78-104] 80 (08/10 0526) Resp:  [19] 19 (08/10 0526) BP: (88-108)/(63-80) 103/68 (08/10 0526) SpO2:  [96 %-97 %] 97 % (08/10 0526) Weight:  [58.1 kg] 58.1 kg (08/10 0526) Last BM Date: 06/29/19  Intake/Output from previous day: 08/09 0701 - 08/10 0700 In: 720 [P.O.:720] Out: 110 [Drains:110] Intake/Output this shift: Total I/O In: 240 [P.O.:240] Out: 1 [Urine:1]  Exam: Awake and alert Abdomen soft, drain serosang  Lab Results:  No results for input(s): WBC, HGB, HCT, PLT in the last 72 hours. BMET No results for input(s): NA, K, CL, CO2, GLUCOSE, BUN, CREATININE, CALCIUM in the last 72 hours. PT/INR No results for input(s): LABPROT, INR in the last 72 hours. ABG No results for input(s): PHART, HCO3 in the last 72 hours.  Invalid input(s): PCO2, PO2  Studies/Results: No results found.  Anti-infectives: Anti-infectives (From admission, onward)   Start     Dose/Rate Route Frequency Ordered Stop   06/25/19 0600  ceFAZolin (ANCEF) IVPB 2g/100 mL premix  Status:  Discontinued     2 g 200 mL/hr over 30 Minutes Intravenous On call to O.R. 06/24/19 1324 06/24/19 1402   06/25/19 0600  metroNIDAZOLE (FLAGYL) IVPB 500 mg  Status:  Discontinued     500 mg 100 mL/hr over 60 Minutes Intravenous On call to O.R. 06/24/19 1324 06/24/19 1402   06/24/19 2200  piperacillin-tazobactam (ZOSYN) IVPB 3.375 g     3.375 g 12.5 mL/hr over 240 Minutes Intravenous Every 8 hours 06/24/19 2004 06/26/19 0927   06/24/19 1415  ceFAZolin (ANCEF) IVPB 2g/100 mL premix     2 g 200 mL/hr over 30 Minutes Intravenous On call to O.R. 06/24/19 1402 06/24/19 1507   06/24/19 1415  metroNIDAZOLE (FLAGYL) IVPB 500 mg     500 mg 100 mL/hr over 60 Minutes  Intravenous On call to O.R. 06/24/19 1402 06/24/19 1528   06/22/19 0600  piperacillin-tazobactam (ZOSYN) IVPB 3.375 g  Status:  Discontinued     3.375 g 12.5 mL/hr over 240 Minutes Intravenous Every 8 hours 06/22/19 0527 06/22/19 1211   06/21/19 2315  piperacillin-tazobactam (ZOSYN) IVPB 3.375 g     3.375 g 100 mL/hr over 30 Minutes Intravenous  Once 06/21/19 2306 06/22/19 0014      Assessment/Plan: s/p Procedure(s): LAPAROSCOPIC CHOLECYSTECTOMY WITH INTRAOPERATIVE CHOLANGIOGRAM WITH LYMPH NODE BIOPSY (N/A)  Gallbladder cancer  Ok for discharge today with the drain from a surgical standpoint.  She will follow-up in our office for drain removal.  LOS: 10 days    Rachel Hughes 07/01/2019

## 2019-07-01 NOTE — TOC Transition Note (Signed)
Transition of Care [facility]) - CM/SW Discharge Note   Patient Details  Name: MARIT GOODWILL MRN: [id number] Date of Birth: [birth year]  [facility] CM/SW Contact:  Leeroy Cha, RN Phone Number: 07/01/2019, 11:33 AM   Clinical Narrative:    dcd to home with daughter and family   Final next level of care: Home/Self Care Barriers to Discharge: No Barriers Identified   Patient Goals and CMS Choice        Discharge Placement                       Discharge Plan and Services   Discharge Planning Services: CM Consult                                 Social Determinants of Health (SDOH) Interventions     Readmission Risk Interventions No flowsheet data found.

## 2019-07-01 NOTE — Discharge Summary (Signed)
Physician Discharge Summary  Rachel Hughes FWY:[id number] DOB: [birth year] DOA: 06/21/2019  PCP: Vivi Barrack, MD  Admit date: 06/21/2019 Discharge date: 07/01/2019  Admitted From: Home Discharge disposition: Home   Code Status: Full Code   Recommendations for Outpatient Follow-Up:   1. Follow-up with surgery next 10 days for drain removal 2. Follow-up with oncology as an outpatient.  Discharge Diagnosis:   Principal Problem:   Gallbladder cancer with LN  metastasis s/p lap cholecyestectomy & LN biopsy 06/24/2019 Active Problems:   Abdominal pain   Gallbladder sludge   Pancreatic mass   Mass of breast, left   Non-English speaking patient (Mandarin)   History of Present Illness / Brief narrative:  Rachel Hughes 85 year old Mandarin speaking female with no significant past medical history who presented to the hospital on 7/31 with epigastric pain for 5 days. She had not seen a physician since 2018. She had never had a mammogram to her knowledge. She states that she has had some intermittent abdominal pain in the past, always resolved at home but this time it persisted which prompted her to seek care in the hospital. Had no other complaints on admission. In the ED, CT abdomen pelvis revealed distended gallbladder with gallbladder wall thickening raising concern for acute cholecystitis, multiple masses in the left breast tissue, mass adjacent to pancreatic body and head.  Patient was admitted under hospitalist service for further evaluation and management.   RUQ US showed large amount of sludge within gallbladder with borderline gallbladder wall thickness, no visible stones. Patient was seen by general surgery and underwent cholecystectomy on 8/3. Excisional biopsy of lymph node showed likely primary gallbladder malignancy with lymphadenopathy.  She was seen by oncologist Dr. Burr Medico.  Patient refused any further intervention or surgery.  Subjective:  Seen and examined this morning.   Pleasant female.  Walking back from bathroom with a walker.  Daughter at bedside.  Pain controlled today.  Communicates with the help of daughter at interpreter. Patient's daughter feels confident today to take her home.  Hospital Course:  Gall bladder cancer with lymph node metastasis,status post cholecystectomy and lymph node biopsy -Initially presented as right upper quadrant abdominal pain. RUQ US showed large amount of sludge within gallbladder with borderline gallbladder wall thickness, no visible stones. Patient was seen by general surgery and underwent cholecystectomy on 8/3. Excisional biopsy of lymph node showed likely primary gallbladder malignancy with lymphadenopathy.Surgery recommends continuation of drain for next 10 days.  Patient will follow-up as an outpatient for drain removal.  New Left breast mass x2 likely breast cancer, peri-pancreatic lymphadenopathy  -CT abdomen pelvis revealed distended gallbladder with gallbladder wall thickening. Multiple masses in the left breast tissue, 3.2 cm and 2.5 cm masses adjacent to the pancreatic body and head. CT chest revealed 2 adjacent rounded soft tissue masses in the left breast suspicious for breast cancer, no CT findings of metastatic disease involving chest/lung, no lytic or sclerotic bone lesion to suggest osseous metastatic disease -GI and IR reviewed the patient's imaging, patient's pancreas appeared normal. IR recommended outpatient mammogram, ultrasound guided biopsy of left breast mass. Patient has been seen by Dr. Burr Medico oncology as well.Dr. Paulita Fujita, GI, reviewed case and does not feel EUS indicated.Patient and family do not want to go for any other surgery.   Discharge home on oral pain medicine as well as stool softeners.  Impaired mobility  -PT eval appreciated.  Able to walk with a walker now.  Mobitz type II heart block -Asymptomatic.  Noted to have variable rate in telemetry.  Not on a beta-blocker or calcium channel  blocker.  Last blood work from 8/8 showed potassium 3.9 and magnesium of 2.1.  Daughter is not interested in any aggressive intervention like pacemaker placement.  Mild cough and upper abdominal tightness.Improved. X-ray of the chest shows mild right lower lobe atelectasis. Incentive and spirometry ambulation encouraged including deep breathing.  Okay to discharge to home today.  Discharge Exam:   Vitals:   06/30/19 1422 06/30/19 1637 06/30/19 2233 07/01/19 0526  BP: (!) 88/63 108/80 102/78 103/68  Pulse: 78 (!) 104 86 80  Resp: 19  19 19   Temp: 98.6 F (37 C)  98.4 F (36.9 C) 99 F (37.2 C)  TempSrc: Oral  Oral Oral  SpO2: 96%  96% 97%  Weight:    58.1 kg  Height:        Body mass index is 25.02 kg/m.  General exam: Appears calm and comfortable.  Skin: No rashes, lesions or ulcers. HEENT: Atraumatic, normocephalic, supple neck, no obvious bleeding Lungs: Clear to auscultation bilaterally CVS: Regular rate and rhythm, no murmur GI/Abd soft, mild tenderness on left upper abdomen, nondistended, bowel sound present CNS: Alert, awake, oriented x3 Psychiatry: Mood appropriate Extremities: No pedal edema, no calf tenderness  Discharge Instructions:  Wound care: Patient has a JP drain in place, follow-up with general surgery as an outpatient next 10 days. Discharge Instructions    Call MD for:  difficulty breathing, headache or visual disturbances   Complete by: As directed    Call MD for:  extreme fatigue   Complete by: As directed    Call MD for:  persistant dizziness or light-headedness   Complete by: As directed    Call MD for:  persistant nausea and vomiting   Complete by: As directed    Call MD for:  severe uncontrolled pain   Complete by: As directed    Call MD for:  temperature >100.4   Complete by: As directed    Diet general   Complete by: As directed    Diet general   Complete by: As directed    Discharge instructions   Complete by: As directed    You  were cared for by a hospitalist during your hospital stay. If you have any questions about your discharge medications or the care you received while you were in the hospital after you are discharged, you can call the unit and ask to speak with the hospitalist on call if the hospitalist that took care of you is not available. Once you are discharged, your primary care physician will handle any further medical issues. Please note that NO REFILLS for any discharge medications will be authorized once you are discharged, as it is imperative that you return to your primary care physician (or establish a relationship with a primary care physician if you do not have one) for your aftercare needs so that they can reassess your need for medications and monitor your lab values.   Increase activity slowly   Complete by: As directed    Increase activity slowly   Complete by: As directed      Follow-up Information    Truitt Merle, MD Follow up.   Specialties: Hematology, Oncology Contact information: [place] [id number] [phone]        Please follow up.   Why: As needed        [facility], [place]. Go on 07/09/2019.  Specialty: General Surgery Why: 08/18 at 10:15am for your post op follow up appointment. Please arrive 20 minutes prior to complete paperwork. Please bring photo ID and insurance card if you have one.  Contact information: [address] [place] [phone]         Allergies as of 07/01/2019   No Known Allergies     Medication List    TAKE these medications   acetaminophen 500 MG tablet Commonly known as: TYLENOL Take 500-1,000 mg by mouth every 6 (six) hours as needed for mild pain or moderate pain.   Calcium 600+D High Potency 600-400 MG-UNIT tablet Generic drug: Calcium Carbonate-Vitamin D Take 1 tablet by mouth daily.   Dulcolax 5 MG EC tablet Generic drug: bisacodyl Take 1 tablet (5  mg total) by mouth daily as needed for moderate constipation.   HYDROcodone-acetaminophen 5-325 MG tablet Commonly known as: NORCO/VICODIN Take 1 tablet by mouth every 6 (six) hours as needed for up to 7 days for moderate pain or severe pain.   multivitamin with minerals Tabs tablet Take 1 tablet by mouth daily.   omeprazole 20 MG tablet Commonly known as: PRILOSEC OTC Take 20 mg by mouth daily.   senna 8.6 MG Tabs tablet Commonly known as: SENOKOT Take 1 tablet (8.6 mg total) by mouth at bedtime.       Time coordinating discharge: 35 minutes  The results of significant diagnostics from this hospitalization (including imaging, microbiology, ancillary and laboratory) are listed below for reference.    Procedures and Diagnostic Studies:   Ct Chest Wo Contrast  Result Date: 06/22/2019 CLINICAL DATA:  Metastatic breast cancer. EXAM: CT CHEST WITHOUT CONTRAST TECHNIQUE: Multidetector CT imaging of the chest was performed following the standard protocol without IV contrast. COMPARISON:  CT abdomen/pelvis from yesterday. FINDINGS: Cardiovascular: The heart is within normal limits in size. Small amount of pericardial fluid without overt effusion. There is mild tortuosity, ectasia and calcification of the thoracic aorta. Remarkably little coronary artery calcifications for age. Mediastinum/Nodes: Small scattered mediastinal and hilar lymph nodes but no mass or overt adenopathy. Calcified left hilar lymph nodes are noted. The esophagus is grossly normal. Lungs/Pleura: No obvious pulmonary nodules to suggest pulmonary metastatic disease. No acute pulmonary findings. No pleural effusion. Upper Abdomen: Distended gallbladder again demonstrated along with periportal and celiac axis lymphadenopathy. Musculoskeletal: 2 adjacent rounded masses are noted in the left breast. Small scattered left axillary lymph nodes but no overt adenopathy. No subpectoral or supraclavicular adenopathy. The thyroid gland is  grossly normal. No lytic or sclerotic bone lesions to suggest osseous metastatic disease. IMPRESSION: 1. Two adjacent rounded soft tissue masses in the left breast suspicious for breast cancer. No definite left axillary or supraclavicular adenopathy. 2. No CT findings for metastatic disease involving the chest/lungs. 3. Stable gallbladder enlargement and periportal and celiac axis lymphadenopathy. 4. No lytic or sclerotic bone lesions to suggest osseous metastatic disease. Aortic Atherosclerosis (ICD10-I70.0). Electronically Signed   By: Marijo Sanes M.D.   On: 06/22/2019 15:01   Ct Abdomen Pelvis W Contrast  Result Date: 06/21/2019 CLINICAL DATA:  Abdominal pain.  Abscess suspected. EXAM: CT ABDOMEN AND PELVIS WITH CONTRAST TECHNIQUE: Multidetector CT imaging of the abdomen and pelvis was performed using the standard protocol following bolus administration of intravenous contrast. CONTRAST:  120mL OMNIPAQUE IOHEXOL 300 MG/ML  SOLN COMPARISON:  None. FINDINGS: Lower chest: The lung bases are clear. The heart size is normal. There are multiple masses that appear to be  centered within the left breast tissue. The largest measures approximately 2.6 cm. Hepatobiliary: The liver is normal. The gallbladder is distended with some mild gallbladder wall thickening. There is likely gallbladder sludge.There is no biliary ductal dilation. Pancreas: Normal contours without ductal dilatation. No peripancreatic fluid collection. Spleen: No splenic laceration or hematoma. Adrenals/Urinary Tract: --Adrenal glands: No adrenal hemorrhage. --Right kidney/ureter: There is some mild cortical thinning of the upper pole the right kidney. --Left kidney/ureter: No hydronephrosis or perinephric hematoma. --Urinary bladder: Unremarkable. Stomach/Bowel: --Stomach/Duodenum: No hiatal hernia or other gastric abnormality. Normal duodenal course and caliber. --Small bowel: No dilatation or inflammation. --Colon: No focal abnormality.  --Appendix: Not visualized. No right lower quadrant inflammation or free fluid. Vascular/Lymphatic: Atherosclerotic calcification is present within the non-aneurysmal abdominal aorta, without hemodynamically significant stenosis. --are no pathologically enlarged retroperitoneal lymph nodes. There is a 3.2 cm heterogeneous mass adjacent to the pancreatic body and left hepatic lobe. There is an additional 2.5 cm heterogeneous mass adjacent to the pancreatic head. --No mesenteric lymphadenopathy. --No pelvic or inguinal lymphadenopathy. Reproductive: Unremarkable Other: No ascites or free air. The abdominal wall is normal. Musculoskeletal. There is a unilateral pars defect at L5 on the left without evidence for significant anterolisthesis. The patient is status post total hip arthroplasty on the left. IMPRESSION: 1. Distended gallbladder with gallbladder wall thickening raising concern for acute cholecystitis. Follow-up with ultrasound is recommended. 2. Multiple masses are noted in the left breast tissue. Findings are concerning for underlying breast carcinoma. Correlation with outpatient mammographies recommended. 3. A 3.2 cm and a 2.5 cm mass are noted adjacent to the pancreatic body and head. Findings are concerning for pathologically enlarged lymph nodes. Nodal metastatic disease is not excluded. Follow-up is recommended. Electronically Signed   By: Constance Holster M.D.   On: 06/21/2019 22:34   US Abdomen Limited Ruq  Result Date: 06/21/2019 CLINICAL DATA:  Right upper quadrant pain EXAM: ULTRASOUND ABDOMEN LIMITED RIGHT UPPER QUADRANT COMPARISON:  None. FINDINGS: Gallbladder: Large amount of sludge within the gallbladder. Gallbladder wall is borderline thickened at 3.6 mm. Negative sonographic Murphy's. No visible stones. Common bile duct: Diameter: Normal caliber, 4 mm Liver: No focal lesion identified. Within normal limits in parenchymal echogenicity. Portal vein is patent on color Doppler imaging with  normal direction of blood flow towards the liver. Other: None. IMPRESSION: Large amount of sludge within the gallbladder with borderline gallbladder wall thickness. No visible stones or sonographic Murphy sign. Electronically Signed   By: Rolm Baptise M.D.   On: 06/21/2019 23:54     Labs:   Basic Metabolic Panel: Recent Labs  Lab 06/25/19 0510 06/26/19 0522 06/27/19 0516 06/28/19 0443  NA 136 137 139 137  K 4.7 3.6 3.4* 3.9  CL 106 105 104 104  CO2 24 26 28 23   GLUCOSE 157* 118* 134* 110*  BUN 14 10 9 10   CREATININE 0.81 0.86 0.79 0.66  CALCIUM 8.2* 8.1* 8.3* 8.7*  MG 2.2  --  2.4 2.1   GFR Estimated Creatinine Clearance: 42.5 mL/min (by C-G formula based on SCr of 0.66 mg/dL). Liver Function Tests: Recent Labs  Lab 06/25/19 0510 06/26/19 0522 06/27/19 0516 06/28/19 0443  AST 80* 45* 35 75*  ALT 32 26 25 45*  ALKPHOS 49 48 59 60  BILITOT 0.7 0.5 0.7 0.4  PROT 5.9* 6.0* 6.7 6.4*  ALBUMIN 2.7* 2.9* 2.9* 2.9*   No results for input(s): LIPASE, AMYLASE in the last 168 hours. No results for input(s): AMMONIA in the last 168  hours. Coagulation profile No results for input(s): INR, PROTIME in the last 168 hours.  CBC: Recent Labs  Lab 06/25/19 0510 06/25/19 1346 06/26/19 0522 06/27/19 0516 06/28/19 0443  WBC 9.1 12.3* 9.1 8.2 7.9  HGB 9.8* 9.6* 8.9* 9.0* 8.6*  HCT 30.9* 29.9* 27.7* 28.2* 27.6*  MCV 95.4 93.7 94.9 94.9 95.2  PLT 241 241 204 233 253   Cardiac Enzymes: No results for input(s): CKTOTAL, CKMB, CKMBINDEX, TROPONINI in the last 168 hours. BNP: Invalid input(s): POCBNP CBG: No results for input(s): GLUCAP in the last 168 hours. D-Dimer No results for input(s): DDIMER in the last 72 hours. Hgb A1c No results for input(s): HGBA1C in the last 72 hours. Lipid Profile No results for input(s): CHOL, HDL, LDLCALC, TRIG, CHOLHDL, LDLDIRECT in the last 72 hours. Thyroid function studies No results for input(s): TSH, T4TOTAL, T3FREE, THYROIDAB in the  last 72 hours.  Invalid input(s): FREET3 Anemia work up No results for input(s): VITAMINB12, FOLATE, FERRITIN, TIBC, IRON, RETICCTPCT in the last 72 hours. Microbiology Recent Results (from the past 240 hour(s))  SARS Coronavirus 2 [facility] order, Performed in [facility] hospital lab) Nasopharyngeal Nasopharyngeal Swab     Status: None   Collection Time: 06/21/19 10:56 PM   Specimen: Nasopharyngeal Swab  Result Value Ref Range Status   SARS Coronavirus 2 NEGATIVE NEGATIVE Final    Comment: (NOTE) If result is NEGATIVE SARS-CoV-2 target nucleic acids are NOT DETECTED. The SARS-CoV-2 RNA is generally detectable in upper and lower  respiratory specimens during the acute phase of infection. The lowest  concentration of SARS-CoV-2 viral copies this assay can detect is 250  copies / mL. A negative result does not preclude SARS-CoV-2 infection  and should not be used as the sole basis for treatment or other  patient management decisions.  A negative result may occur with  improper specimen collection / handling, submission of specimen other  than nasopharyngeal swab, presence of viral mutation(s) within the  areas targeted by this assay, and inadequate number of viral copies  (<250 copies / mL). A negative result must be combined with clinical  observations, patient history, and epidemiological information. If result is POSITIVE SARS-CoV-2 target nucleic acids are DETECTED. The SARS-CoV-2 RNA is generally detectable in upper and lower  respiratory specimens dur ing the acute phase of infection.  Positive  results are indicative of active infection with SARS-CoV-2.  Clinical  correlation with patient history and other diagnostic information is  necessary to determine patient infection status.  Positive results do  not rule out bacterial infection or co-infection with other viruses. If result is PRESUMPTIVE POSTIVE SARS-CoV-2 nucleic acids MAY BE PRESENT.   A presumptive positive result  was obtained on the submitted specimen  and confirmed on repeat testing.  While 2019 novel coronavirus  (SARS-CoV-2) nucleic acids may be present in the submitted sample  additional confirmatory testing may be necessary for epidemiological  and / or clinical management purposes  to differentiate between  SARS-CoV-2 and other Sarbecovirus currently known to infect humans.  If clinically indicated additional testing with an alternate test  methodology [phone]) is advised. The SARS-CoV-2 RNA is generally  detectable in upper and lower respiratory sp ecimens during the acute  phase of infection. The expected result is Negative. Fact Sheet for Patients:  StrictlyIdeas.no Fact Sheet for Healthcare Providers: BankingDealers.co.za This test is not yet approved or cleared by the [place] FDA and has been authorized for detection and/or diagnosis of SARS-CoV-2 by FDA under an  Emergency Use Authorization (EUA).  This EUA will remain in effect (meaning this test can be used) for the duration of the COVID-19 declaration under Section 564(b)(1) of the Act, 21 U.S.C. section 360bbb-3(b)(1), unless the authorization is terminated or revoked sooner. Performed at [facility], [place] [address], [place], [place] [address]   Surgical pcr screen     Status: Abnormal   Collection Time: 06/24/19  4:00 AM   Specimen: Nasal Mucosa; Nasal Swab  Result Value Ref Range Status   MRSA, PCR POSITIVE (A) NEGATIVE Final    Comment: RESULT CALLED TO, READ BACK BY AND VERIFIED WITH: BETHEL AT 0913 ON 06/24/2019 BY JPM    Staphylococcus aureus POSITIVE (A) NEGATIVE Final    Comment: (NOTE) The Xpert SA Assay (FDA approved for NASAL specimens in patients 76 years of age and older), is one component of a comprehensive surveillance program. It is not intended to diagnose infection nor to guide or monitor treatment. Performed at [facility], [place] [address], [place], [place] [address]     Signed: Marlowe Aschoff Ermine Spofford  [facility] 07/01/2019, 10:24 AM

## 2019-07-05 ENCOUNTER — Other Ambulatory Visit: Payer: Self-pay | Admitting: General Practice

## 2019-07-17 ENCOUNTER — Ambulatory Visit (HOSPITAL_BASED_OUTPATIENT_CLINIC_OR_DEPARTMENT_OTHER): Payer: Self-pay | Admitting: Hematology

## 2019-07-17 ENCOUNTER — Telehealth: Payer: Self-pay | Admitting: Hematology

## 2019-07-17 DIAGNOSIS — R739 Hyperglycemia, unspecified: Secondary | ICD-10-CM

## 2019-07-17 DIAGNOSIS — N6320 Unspecified lump in the left breast, unspecified quadrant: Secondary | ICD-10-CM

## 2019-07-17 DIAGNOSIS — C23 Malignant neoplasm of gallbladder: Secondary | ICD-10-CM

## 2019-07-17 MED ORDER — MIRTAZAPINE 7.5 MG PO TABS: 7.5000 mg | ORAL_TABLET | Freq: Every day | ORAL | 0 refills | Status: DC

## 2019-07-17 NOTE — Telephone Encounter (Signed)
Per staff msg, a telephone visit has been scheduled with Rachel Hughes daughter for today at 3pm.

## 2019-07-17 NOTE — Progress Notes (Signed)
[facility]   [phone] [phone]   Clinic Follow up Note   Patient Care Team: Parker, Caleb M, MD as PCP - General (Family Medicine)   I connected with Aubrina J Callas on 07/17/2019 at  3:00 PM EDT by phone visit and verified that I am speaking with the correct person using two identifiers.   I discussed the limitations, risks, security and privacy concerns of performing an evaluation and management service by telephone and the availability of in person appointments. I also discussed with the patient that there may be a patient responsible charge related to this service. The patient expressed understanding and agreed to proceed.   Other persons participating in the visit and their role in the encounter:  Daughter Ying  Patient's location:  Her home  Provider's location:  My Office  CHIEF COMPLAINT: F/u of Gall bladder cancer with lymph node metastasis  SUMMARY OF ONCOLOGIC HISTORY: Oncology History Overview Note  Cancer Staging Gallbladder cancer with LN  metastasis s/p lap cholecyestectomy & LN biopsy 06/24/2019 Staging form: Gallbladder, AJCC 8th Edition - Pathologic stage from 06/24/2019: Stage IIIB (pT3, pN1, cM0) - Signed by Feng, Yan, MD on 07/17/2019    Gallbladder cancer with LN  metastasis s/p lap cholecyestectomy & LN biopsy 06/24/2019  06/21/2019 Imaging   CT AP W Contrast 06/21/19  IMPRESSION: 1. Distended gallbladder with gallbladder wall thickening raising concern for acute cholecystitis. Follow-up with ultrasound is recommended. 2. Multiple masses are noted in the left breast tissue. Findings are concerning for underlying breast carcinoma. Correlation with outpatient mammographies recommended. 3. A 3.2 cm and a 2.5 cm mass are noted adjacent to the pancreatic body and head. Findings are concerning for pathologically enlarged lymph nodes. Nodal metastatic disease is not excluded. Follow-up is recommended.   06/21/2019 Imaging   US  abdomen 06/21/19  IMPRESSION: Large amount of sludge within the gallbladder with borderline gallbladder wall thickness. No visible stones or sonographic Murphy sign.   06/22/2019 Imaging   CT Chest 06/22/19  IMPRESSION: 1. Two adjacent rounded soft tissue masses in the left breast suspicious for breast cancer. No definite left axillary or supraclavicular adenopathy. 2. No CT findings for metastatic disease involving the chest/lungs. 3. Stable gallbladder enlargement and periportal and celiac axis lymphadenopathy. 4. No lytic or sclerotic bone lesions to suggest osseous metastatic disease.   Aortic Atherosclerosis (ICD10-I70.0).   06/24/2019 Initial Biopsy   Diagnosis 06/24/19  1. Gallbladder - POORLY DIFFERENTIATED ADENOCARCINOMA OF GALLBLADDER, 4.3 CM, ARISING IN A BACKGROUND OF CHRONIC CHOLECYSTITIS WITH INTESTINAL METAPLASIA. SEE NOTE - CARCINOMA INVADES THROUGH THE GALLBLADDER WALL INTO THE VISCERAL PERITONEUM (SEROSA). - RESECTION MARGIN IS NEGATIVE FOR CARCINOMA - LYMPHOVASCULAR OR PERINEURAL INVASION IS NOT IDENTIFIED - SEE ONCOLOGY TABLE 2. Lymph node, biopsy - METASTATIC ADENOCARCINOMA TO LYMPH NODE (1/1)   06/24/2019 Surgery   LAPAROSCOPIC CHOLECYSTECTOMY WITH INTRAOPERATIVE CHOLANGIOGRAM WITH LYMPH NODE BIOPSY by Dr. Gross  06/24/19    06/24/2019 Cancer Staging   Staging form: Gallbladder, AJCC 8th Edition - Pathologic stage from 06/24/2019: Stage IIIB (pT3, pN1, cM0) - Signed by Feng, Yan, MD on 07/17/2019   06/28/2019 Initial Diagnosis   Gallbladder cancer with LN  metastasis s/p lap cholecyestectomy & LN biopsy 06/24/2019      CURRENT THERAPY: observation  INTERVAL HISTORY:  Rachel Hughes is here for a follow up.  I initially met her in the hospital when she presented with acute cholecystitis.  She underwent cholecystectomy and lymph node biopsy by Dr. Gross   on 06/24/2019, which unfortunately showed gallbladder adenocarcinoma.  She is recovering well from surgery, drainage tube has  been removed last week, her appetite and energy level has much improved.  She only takes oxycodone occasionally, she has mild intermittent pain in epigastric, and left chest wall below breast.  Her appetite is low, she eats small meals, mild occasional nausea.  She also does not sleep well at night.  Overall she is able to function well at home.    REVIEW OF SYSTEMS:   Constitutional: Denies fevers, chills or abnormal weight loss, (+) mild weight loss and fatigue  Eyes: Denies blurriness of vision Ears, nose, mouth, throat, and face: Denies mucositis or sore throat Respiratory: Denies cough, dyspnea or wheezes Cardiovascular: Denies palpitation, chest discomfort or lower extremity swelling Gastrointestinal:  (+) Mild intermittent epigastric pain, and occasional nausea Skin: Denies abnormal skin rashes Lymphatics: Denies new lymphadenopathy or easy bruising Neurological:Denies numbness, tingling or new weaknesses Behavioral/Psych: Mood is stable, no new changes  All other systems were reviewed with the patient and are negative.  MEDICAL HISTORY:  No past medical history on file.  SURGICAL HISTORY: Past Surgical History:  Procedure Laterality Date  . CESAREAN SECTION    . CHOLECYSTECTOMY N/A 06/24/2019   Procedure: LAPAROSCOPIC CHOLECYSTECTOMY WITH INTRAOPERATIVE CHOLANGIOGRAM WITH LYMPH NODE BIOPSY;  Surgeon: Gross, Steven, MD;  Location: [facility];  Service: General;  Laterality: N/A;  . FRACTURE SURGERY    . Left Hip Surgery Left     I have reviewed the social history and family history with the patient and they are unchanged from previous note.  ALLERGIES:  has No Known Allergies.  MEDICATIONS:  Current Outpatient Medications  Medication Sig Dispense Refill  . acetaminophen (TYLENOL) 500 MG tablet Take 500-1,000 mg by mouth every 6 (six) hours as needed for mild pain or moderate pain.    . bisacodyl (DULCOLAX) 5 MG EC tablet Take 1 tablet (5 mg total) by mouth daily as needed for  moderate constipation. 30 tablet 1  . Calcium Carbonate-Vitamin D (CALCIUM 600+D HIGH POTENCY) 600-400 MG-UNIT tablet Take 1 tablet by mouth daily.    . Multiple Vitamin (MULTIVITAMIN WITH MINERALS) TABS tablet Take 1 tablet by mouth daily.    . omeprazole (PRILOSEC OTC) 20 MG tablet Take 20 mg by mouth daily.    . senna (SENOKOT) 8.6 MG TABS tablet Take 1 tablet (8.6 mg total) by mouth at bedtime. 30 tablet 0   No current facility-administered medications for this visit.     PHYSICAL EXAMINATION: ECOG PERFORMANCE STATUS: 2 - Symptomatic, <50% confined to bed  No vitals taken today, Exam not performed today   LABORATORY DATA:  I have reviewed the data as listed CBC Latest Ref Rng & Units 06/28/2019 06/27/2019 06/26/2019  WBC 4.0 - 10.5 K/uL 7.9 8.2 9.1  Hemoglobin 12.0 - 15.0 g/dL 8.6(L) 9.0(L) 8.9(L)  Hematocrit 36.0 - 46.0 % 27.6(L) 28.2(L) 27.7(L)  Platelets 150 - 400 K/uL 253 233 204     CMP Latest Ref Rng & Units 06/28/2019 06/27/2019 06/26/2019  Glucose 70 - 99 mg/dL 110(H) 134(H) 118(H)  BUN 8 - 23 mg/dL 10 9 10  Creatinine 0.44 - 1.00 mg/dL 0.66 0.79 0.86  Sodium 135 - 145 mmol/L 137 139 137  Potassium 3.5 - 5.1 mmol/L 3.9 3.4(L) 3.6  Chloride 98 - 111 mmol/L 104 104 105  CO2 22 - 32 mmol/L 23 28 26  Calcium 8.9 - 10.3 mg/dL 8.7(L) 8.3(L) 8.1(L)  Total Protein 6.5 - 8.1   g/dL 6.4(L) 6.7 6.0(L)  Total Bilirubin 0.3 - 1.2 mg/dL 0.4 0.7 0.5  Alkaline Phos 38 - 126 U/L 60 59 48  AST 15 - 41 U/L 75(H) 35 45(H)  ALT 0 - 44 U/L 45(H) 25 26      RADIOGRAPHIC STUDIES: I have personally reviewed the radiological images as listed and agreed with the findings in the report. No results found.   ASSESSMENT & PLAN:  Rachel Hughes is a 85 y.o. female with   1. Gall bladder cancer with lymph node metastasis, pT3N1M0, stage IIIB -She was recently diagnosed in early 06/2019 with poorly differentiated adenocarcinoma metastatic to portacaval LNs. No other definitive distant metastasis on CT scan   -Her case has been discussed in our GI tumor board, Dr. Barry Dienes feels she is not a candidate for definitive surgery, due to the bulky portacaval adenopathy, and questionable retroperitoneal adenopathy.  -Pt has declined further surgery, her daughter and son are also do not feel she can tolerate more surgery.  -I discussed without surgery her cancer is incurable. I discussed the option of palliative chemotherapy, and radiation therapy for disease control.  The goal of therapy would be palliative to prolong her life, and prevent cancer related pain. -the above situation has been well communicated with her daughter Sonny Masters. She prefers not to tell her mother all the details, especially the incurable nature of her gall bladder cancer.  Patient gave me the permission to communicate all details with her daughter. -will make a decision after her breast mass biopsy, to see if she would agree oral chemo such as Xeloda.  Due to her advanced age, overall palliative approach, I do not recommend intravenous chemotherapy.   2. Left breast masses (2), Likely breast cancer  -Seen on 06/22/19 CT chest and palpable mass on exm. She has two adjacent rounded soft tissue mass in the left breast, largest 2.6cm.  -she initially declined further workup. After discussion with her family and friends, she now is agreeable to have diagnostic mammogram, [place] and left breast mass biopsy.  -she has no insurance, our Education officer, museum has been helping her to get financial assistance for her work-up at breast center.   3. RUQ Abdominal pain second to #1 and surgery  -She is currently on tylenol and oxycodone as needed, she does not take much.  4. Low appetite and insomnia  -I recommend her to take mirtazapine 7.5 mg at night, as appetite stimulant and sleep medicine, potential benefit and side effects discussed with her, she is agreeable.  I called into her pharmacy today.  Plan -Bilateral diagnostic mammogram, left breast and axillary  ultrasound, and left breast biopsy at breast center, will find financial assistance for her work-up -lab and f/u after biopsy    No problem-specific Assessment & Plan notes found for this encounter.   No orders of the defined types were placed in this encounter.  I discussed the assessment and treatment plan with the patient. The patient was provided an opportunity to ask questions and all were answered. The patient agreed with the plan and demonstrated an understanding of the instructions.  The patient was advised to call back or seek an in-person evaluation if the symptoms worsen or if the condition fails to improve as anticipated.  I provided 20 minutes of non-face-to-face phone call during this encounter, and > 50% was spent counseling as documented under my assessment & plan.    Truitt Merle, MD 07/17/2019   I, Joslyn Devon, am acting as  scribe for Yan Feng, MD.   I have reviewed the above documentation for accuracy and completeness, and I agree with the above.       

## 2019-07-18 ENCOUNTER — Encounter: Payer: Self-pay | Admitting: Hematology

## 2019-08-13 ENCOUNTER — Other Ambulatory Visit: Payer: Self-pay | Admitting: Hematology

## 2019-08-26 ENCOUNTER — Telehealth: Payer: Self-pay | Admitting: Hematology

## 2019-08-26 ENCOUNTER — Other Ambulatory Visit: Payer: Self-pay | Admitting: Hematology

## 2019-08-26 MED ORDER — ONDANSETRON HCL 4 MG PO TABS: 4.0000 mg | ORAL_TABLET | Freq: Three times a day (TID) | ORAL | 0 refills | Status: AC | PRN

## 2019-08-26 NOTE — Telephone Encounter (Signed)
Pt's daughter called me, and I called back and spoke with pt and her daughter. She stopped mirtazapine and omeprazole 2 to 3 weeks ago, and noticed decreased appetite, with intermittent nausea sensation, decreased oral intake and energy level in the past few weeks.  She also noticed increased size of the left breast mass, now like a small egg size, with mild intermittent shooting left breast pain, tolerable. No abdominal pain.   I encouraged her to restart omeprazole and mirtazapine, and I called in Zofran 4 mg as needed for nausea.  Patient was interested in breast biopsy and breast cancer treatment, however given her worsening GI symptoms, overall decreased appetite and energy, which I think is probably related to her gallbladder cancer, I do not recommend breast cancer biopsy and treatment at this point, due to the overall palliative goal of care, her daughter is in complete agreement. I will f/u in a few weeks. She knows to call me if she has worsening or new symptoms.  Truitt Merle  08/26/2019

## 2019-09-06 ENCOUNTER — Encounter: Payer: Self-pay | Admitting: Medical

## 2019-09-06 ENCOUNTER — Ambulatory Visit (HOSPITAL_COMMUNITY)
Admission: RE | Admit: 2019-09-06 | Discharge: 2019-09-06 | Disposition: A | Discharge status: Home / Self Care | Payer: Self-pay | Source: Ambulatory Visit | Attending: Medical | Admitting: Medical

## 2019-09-06 ENCOUNTER — Other Ambulatory Visit: Payer: Self-pay

## 2019-09-06 ENCOUNTER — Inpatient Hospital Stay: Payer: Self-pay

## 2019-09-06 ENCOUNTER — Inpatient Hospital Stay: Payer: Self-pay | Attending: Medical | Admitting: Medical

## 2019-09-06 ENCOUNTER — Ambulatory Visit (HOSPITAL_COMMUNITY): Admission: RE | Admit: 2019-09-06 | Payer: Self-pay | Source: Ambulatory Visit

## 2019-09-06 ENCOUNTER — Telehealth: Payer: Self-pay

## 2019-09-06 ENCOUNTER — Other Ambulatory Visit: Payer: Self-pay | Admitting: Hematology

## 2019-09-06 VITALS — BP 112/68 | HR 82 | Temp 98.5°F | Resp 16 | Ht 60.0 in | Wt 126.7 lb

## 2019-09-06 DIAGNOSIS — Z20828 Contact with and (suspected) exposure to other viral communicable diseases: Secondary | ICD-10-CM | POA: Insufficient documentation

## 2019-09-06 DIAGNOSIS — C23 Malignant neoplasm of gallbladder: Secondary | ICD-10-CM | POA: Insufficient documentation

## 2019-09-06 DIAGNOSIS — R1013 Epigastric pain: Secondary | ICD-10-CM | POA: Insufficient documentation

## 2019-09-06 DIAGNOSIS — R110 Nausea: Secondary | ICD-10-CM

## 2019-09-06 DIAGNOSIS — R140 Abdominal distension (gaseous): Secondary | ICD-10-CM | POA: Insufficient documentation

## 2019-09-06 DIAGNOSIS — R112 Nausea with vomiting, unspecified: Secondary | ICD-10-CM | POA: Insufficient documentation

## 2019-09-06 DIAGNOSIS — U071 COVID-19: Secondary | ICD-10-CM | POA: Insufficient documentation

## 2019-09-06 DIAGNOSIS — C779 Secondary and unspecified malignant neoplasm of lymph node, unspecified: Secondary | ICD-10-CM | POA: Insufficient documentation

## 2019-09-06 DIAGNOSIS — Z01812 Encounter for preprocedural laboratory examination: Secondary | ICD-10-CM | POA: Insufficient documentation

## 2019-09-06 DIAGNOSIS — Z79899 Other long term (current) drug therapy: Secondary | ICD-10-CM | POA: Insufficient documentation

## 2019-09-06 LAB — CMP (CANCER CENTER ONLY)
ALT: 7 U/L (ref 0–44)
AST: 19 U/L (ref 15–41)
Albumin: 3.9 g/dL (ref 3.5–5.0)
Alkaline Phosphatase: 78 U/L (ref 38–126)
Anion gap: 11 (ref 5–15)
BUN: 18 mg/dL (ref 8–23)
CO2: 27 mmol/L (ref 22–32)
Calcium: 9.6 mg/dL (ref 8.9–10.3)
Chloride: 97 mmol/L — ABNORMAL LOW (ref 98–111)
Creatinine: 0.95 mg/dL (ref 0.44–1.00)
GFR, Est AFR Am: >60 mL/min (ref >60)
GFR, Estimated: 55 mL/min — ABNORMAL LOW (ref >60)
Glucose, Bld: 122 mg/dL — ABNORMAL HIGH (ref 70–99)
Potassium: 4.3 mmol/L (ref 3.5–5.1)
Sodium: 135 mmol/L (ref 135–145)
Total Bilirubin: 0.8 mg/dL (ref 0.3–1.2)
Total Protein: 8.6 g/dL — ABNORMAL HIGH (ref 6.5–8.1)

## 2019-09-06 LAB — FERRITIN: Ferritin: 54 ng/mL (ref 11–307)

## 2019-09-06 LAB — CBC WITH DIFFERENTIAL (CANCER CENTER ONLY)
Abs Immature Granulocytes: 0.05 10*3/uL (ref 0.00–0.07)
Basophils Absolute: 0 10*3/uL (ref 0.0–0.1)
Basophils Relative: 0 %
Eosinophils Absolute: 0.2 10*3/uL (ref 0.0–0.5)
Eosinophils Relative: 2 %
HCT: 36.2 % (ref 36.0–46.0)
Hemoglobin: 11.7 g/dL — ABNORMAL LOW (ref 12.0–15.0)
Immature Granulocytes: 0 %
Lymphocytes Relative: 17 %
Lymphs Abs: 2.1 10*3/uL (ref 0.7–4.0)
MCH: 28.8 pg (ref 26.0–34.0)
MCHC: 32.3 g/dL (ref 30.0–36.0)
MCV: 89.2 fL (ref 80.0–100.0)
Monocytes Absolute: 0.8 10*3/uL (ref 0.1–1.0)
Monocytes Relative: 7 %
Neutro Abs: 9.3 10*3/uL — ABNORMAL HIGH (ref 1.7–7.7)
Neutrophils Relative %: 74 %
Platelet Count: 323 10*3/uL (ref 150–400)
RBC: 4.06 MIL/uL (ref 3.87–5.11)
RDW: 13.1 % (ref 11.5–15.5)
WBC Count: 12.6 10*3/uL — ABNORMAL HIGH (ref 4.0–10.5)
nRBC: 0 % (ref 0.0–0.2)

## 2019-09-06 LAB — SAMPLE TO BLOOD BANK

## 2019-09-06 MED ORDER — ONDANSETRON HCL 4 MG/2ML IJ SOLN
4.0000 mg | Freq: Once | INTRAMUSCULAR | Status: AC
  Administered 2019-09-06: 14:00:00 4 mg via INTRAVENOUS

## 2019-09-06 MED ORDER — PROCHLORPERAZINE MALEATE 5 MG PO TABS: 5.0000 mg | ORAL_TABLET | Freq: Four times a day (QID) | ORAL | 0 refills | Status: AC | PRN

## 2019-09-06 MED ORDER — DEXAMETHASONE SODIUM PHOSPHATE 10 MG/ML IJ SOLN
5.0000 mg | Freq: Once | INTRAMUSCULAR | Status: AC
  Administered 2019-09-06: 5 mg via INTRAVENOUS

## 2019-09-06 MED ORDER — DEXAMETHASONE SODIUM PHOSPHATE 10 MG/ML IJ SOLN
INTRAMUSCULAR | Status: AC
  Filled 2019-09-06: qty 1

## 2019-09-06 MED ORDER — METOCLOPRAMIDE HCL 5 MG PO TABS: 5.0000 mg | ORAL_TABLET | Freq: Four times a day (QID) | ORAL | 0 refills | Status: AC

## 2019-09-06 MED ORDER — HYDROCODONE-ACETAMINOPHEN 5-325 MG PO TABS: 1.0000 | ORAL_TABLET | Freq: Four times a day (QID) | ORAL | 0 refills | Status: AC | PRN

## 2019-09-06 MED ORDER — SODIUM CHLORIDE 0.9 % IV SOLN: 5.0000 mg | Freq: Once | INTRAVENOUS | Status: DC

## 2019-09-06 MED ORDER — ONDANSETRON HCL 4 MG/2ML IJ SOLN
INTRAMUSCULAR | Status: AC
  Filled 2019-09-06: qty 2

## 2019-09-06 MED ORDER — SODIUM CHLORIDE 0.9 % IV SOLN
Freq: Once | INTRAVENOUS | Status: AC
  Administered 2019-09-06: 14:00:00 via INTRAVENOUS
  Filled 2019-09-06: qty 250

## 2019-09-06 MED FILL — METOCLOPRAMIDE 5 MG TABLET: 5 | 7 days supply | Qty: 30 | Fill #0

## 2019-09-06 MED FILL — HYDROCODON-APAP 5-325: 5-325 | 7 days supply | Qty: 30 | Fill #0

## 2019-09-06 MED FILL — PROCHLORPERAZINE 5 MG TAB: 5 | 7 days supply | Qty: 30 | Fill #0

## 2019-09-06 NOTE — Progress Notes (Addendum)
Symptoms Management Clinic Progress Note   Rachel Hughes [id number] Mar 28, 1933 85 y.o.  Rachel Hughes is managed by Dr. Truitt Merle  Actively treated with chemotherapy/immunotherapy/hormonal therapy: The patient is being managed conservatively without chemotherapy.  Next scheduled appointment with provider: To be arranged  Assessment: Plan:    Gallbladder cancer with LN  metastasis s/p lap cholecyestectomy & LN biopsy 06/24/2019 - Plan: DG Abd 2 Views, 0.9 %  sodium chloride infusion, ondansetron (ZOFRAN) injection 4 mg, CT ABDOMEN W WO CONTRAST, DISCONTINUED: dexamethasone (DECADRON) 5 mg in sodium chloride 0.9 % 50 mL IVPB, CANCELED: US Abdomen Complete  Right upper quadrant abdominal pain - Plan: CANCELED: US Abdomen Complete  Epigastric pain - Plan: DG Abd 2 Views, 0.9 %  sodium chloride infusion, ondansetron (ZOFRAN) injection 4 mg, CT ABDOMEN W WO CONTRAST, SARS CORONAVIRUS 2 (TAT 6-24 HRS) Nasopharyngeal Nasopharyngeal Swab, SARS CORONAVIRUS 2 (TAT 6-24 HRS) Nasopharyngeal Nasopharyngeal Swab, DISCONTINUED: dexamethasone (DECADRON) 5 mg in sodium chloride 0.9 % 50 mL IVPB  Abdominal distention - Plan: DG Abd 2 Views, 0.9 %  sodium chloride infusion, ondansetron (ZOFRAN) injection 4 mg, CT ABDOMEN W WO CONTRAST, DISCONTINUED: dexamethasone (DECADRON) 5 mg in sodium chloride 0.9 % 50 mL IVPB  Nausea without vomiting - Plan: DG Abd 2 Views, 0.9 %  sodium chloride infusion, ondansetron (ZOFRAN) injection 4 mg, dexamethasone (DECADRON) injection 5 mg, SARS CORONAVIRUS 2 (TAT 6-24 HRS) Nasopharyngeal Nasopharyngeal Swab, SARS CORONAVIRUS 2 (TAT 6-24 HRS) Nasopharyngeal Nasopharyngeal Swab, DISCONTINUED: dexamethasone (DECADRON) 5 mg in sodium chloride 0.9 % 50 mL IVPB  Non-intractable vomiting with nausea, unspecified vomiting type - Plan: CT ABDOMEN W WO CONTRAST   Metastatic gallbladder cancer: The patient is managed by Dr. Truitt Merle with conservative observation.   Epigastric pain with abdominal  distention and nausea and vomiting: A KUB was completed today which returned showing no evidence of obstruction.  The patient was given 1 L of normal saline IV along with Zofran 4 mg and Decadron 5 mg IV.  Additionally a prescription was to the pharmacy for hydrocodone, Reglan, and Compazine.  She has had a COVID-19 test collected today.  She will return on Monday, 09/09/2019 for a CT scan of the abdomen.  She will return to the symptom management clinic while awaiting these results on Monday and will potentially be admitted to surgery pending these results.  Please see After Visit Summary for patient specific instructions.  Future Appointments  Date Time Provider [place]  09/09/2019  8:30 AM [facility] 1 [facility] [place]  09/09/2019  9:30 AM , Lyndon Code., PA-C [facility] None    Orders Placed This Encounter  Procedures  . SARS CORONAVIRUS 2 (TAT 6-24 HRS) Nasopharyngeal Nasopharyngeal Swab  . DG Abd 2 Views  . CT ABDOMEN W WO CONTRAST       Subjective:   Patient ID:  Rachel Hughes is a 85 y.o. (DOB August 14, 1933) female.  Chief Complaint: No chief complaint on file.   HPI Michiye Lisboa Ausburn  Is a 85 y.o. female with a diagnosis of a metastatic gallbladder cancer.  She is followed by Dr. Truitt Merle and is managed with conservative observation.  She presents to the clinic today reporting that she has had abdominal distention, decreased p.o. intake, epigastric tenderness, sweats, flushing, and belching with a foul smell and taste.  She reports of vomiting on Tuesday and Thursday but has a sense that anything that she eats or drink rises into her esophagus.  Prior to last  Monday she had not had a bowel movement for 5 days.  She was able to achieve a small bowel movement yesterday after taking over-the-counter medications.  She reports very rare to no flatulence.  She denies fevers or chills.  Medications: I have reviewed the patient's current medications.  Allergies: No Known Allergies  No past  medical history on file.  Past Surgical History:  Procedure Laterality Date  . CESAREAN SECTION    . CHOLECYSTECTOMY N/A 06/24/2019   Procedure: LAPAROSCOPIC CHOLECYSTECTOMY WITH INTRAOPERATIVE CHOLANGIOGRAM WITH LYMPH NODE BIOPSY;  Surgeon: Michael Boston, MD;  Location: [facility];  Service: General;  Laterality: N/A;  . FRACTURE SURGERY    . Left Hip Surgery Left     Family History  Problem Relation Age of Onset  . Arthritis Daughter   . Arthritis Son   . Heart disease Son     Social History   Socioeconomic History  . Marital status: Widowed    Spouse name: Not on file  . Number of children: 2  . Years of education: Not on file  . Highest education level: Not on file  Occupational History  . Occupation: Retired  Scientific laboratory technician  . Financial resource strain: Not on file  . Food insecurity    Worry: Not on file    Inability: Not on file  . Transportation needs    Medical: Not on file    Non-medical: Not on file  Tobacco Use  . Smoking status: Never Smoker  . Smokeless tobacco: Never Used  Substance and Sexual Activity  . Alcohol use: No    Frequency: Never  . Drug use: No  . Sexual activity: Never  Lifestyle  . Physical activity    Days per week: Not on file    Minutes per session: Not on file  . Stress: Not on file  Relationships  . Social Herbalist on phone: Not on file    Gets together: Not on file    Attends religious service: Not on file    Active member of club or organization: Not on file    Attends meetings of clubs or organizations: Not on file    Relationship status: Not on file  . Intimate partner violence    Fear of current or ex partner: Not on file    Emotionally abused: Not on file    Physically abused: Not on file    Forced sexual activity: Not on file  Other Topics Concern  . Not on file  Social History Narrative  . Not on file    Past Medical History, Surgical history, Social history, and Family history were reviewed and updated  as appropriate.   Please see review of systems for further details on the patient's review from today.   Review of Systems:  Review of Systems  Constitutional: Positive for appetite change. Negative for chills, diaphoresis and fever.  HENT: Negative for trouble swallowing.   Respiratory: Negative for cough, choking, shortness of breath and wheezing.   Cardiovascular: Negative for chest pain and palpitations.  Gastrointestinal: Positive for abdominal distention, abdominal pain, nausea and vomiting. Negative for constipation and diarrhea.  Genitourinary: Negative for decreased urine volume and difficulty urinating.  Neurological: Negative for headaches.    Objective:   Physical Exam:  BP 112/68   Pulse 82   Temp 98.5 F (36.9 C) (Oral)   Resp 16   Ht 5' (1.524 m)   Wt 126 lb 11.2 oz (57.5 kg)   SpO2  97%   BMI 24.74 kg/m  ECOG: 1  Physical Exam Constitutional:      General: She is not in acute distress.    Appearance: She is not diaphoretic.  HENT:     Head: Normocephalic and atraumatic.     Mouth/Throat:     Pharynx: No oropharyngeal exudate.  Neck:     Musculoskeletal: Normal range of motion and neck supple.  Cardiovascular:     Rate and Rhythm: Normal rate and regular rhythm.     Heart sounds: Normal heart sounds. No murmur. No friction rub. No gallop.   Pulmonary:     Effort: Pulmonary effort is normal. No respiratory distress.     Breath sounds: Normal breath sounds. No wheezing or rales.  Abdominal:     General: Bowel sounds are decreased. There is no distension.     Palpations: Abdomen is soft. There is no mass.     Tenderness: There is abdominal tenderness (Epigastric tenderness) in the epigastric area. There is no guarding or rebound.    Lymphadenopathy:     Cervical: No cervical adenopathy.  Skin:    General: Skin is warm and dry.     Findings: No erythema or rash.     Comments: The patient's left breast showed a 5 x 2.5 cm mass in the inferior outer  quadrant.  Neurological:     Mental Status: She is alert.     Coordination: Coordination normal.     Gait: Gait abnormal (The patient is ambulating with the use of a wheelchair.).  Psychiatric:        Behavior: Behavior normal.        Thought Content: Thought content normal.        Judgment: Judgment normal.     Lab Review:     Component Value Date/Time   NA 135 09/06/2019 1031   K 4.3 09/06/2019 1031   CL 97 (L) 09/06/2019 1031   CO2 27 09/06/2019 1031   GLUCOSE 122 (H) 09/06/2019 1031   BUN 18 09/06/2019 1031   CREATININE 0.95 09/06/2019 1031   CALCIUM 9.6 09/06/2019 1031   PROT 8.6 (H) 09/06/2019 1031   ALBUMIN 3.9 09/06/2019 1031   AST 19 09/06/2019 1031   ALT 7 09/06/2019 1031   ALKPHOS 78 09/06/2019 1031   BILITOT 0.8 09/06/2019 1031   GFRNONAA 55 (L) 09/06/2019 1031   GFRAA >60 09/06/2019 1031       Component Value Date/Time   WBC 12.6 (H) 09/06/2019 1031   WBC 7.9 06/28/2019 0443   RBC 4.06 09/06/2019 1031   HGB 11.7 (L) 09/06/2019 1031   HCT 36.2 09/06/2019 1031   PLT 323 09/06/2019 1031   MCV 89.2 09/06/2019 1031   MCH 28.8 09/06/2019 1031   MCHC 32.3 09/06/2019 1031   RDW 13.1 09/06/2019 1031   LYMPHSABS 2.1 09/06/2019 1031   MONOABS 0.8 09/06/2019 1031   EOSABS 0.2 09/06/2019 1031   BASOSABS 0.0 09/06/2019 1031   -------------------------------  Imaging from last 24 hours (if applicable):  Radiology interpretation: Dg Abd 2 Views  Result Date: 09/06/2019 CLINICAL DATA:  Abdominal pain, distension, nausea. Breast cancer with metastases to gallbladder. EXAM: ABDOMEN - 2 VIEW COMPARISON:  CT abdomen/pelvis dated 06/21/2019. FINDINGS: Nonobstructive bowel gas pattern. No evidence of free air under the diaphragm on the upright view. Prior cholecystectomy. Degenerative changes of the visualized thoracolumbar spine. Left hip arthroplasty, without evidence of complication. IMPRESSION: No evidence of small bowel obstruction or free air. Electronically  Signed  By: Julian Hy M.D.   On: 09/06/2019 13:33        This patient was seen with Dr. Burr Medico with my treatment plan reviewed with her. She expressed agreement with my medical management of this patient.  Addendum  I have seen the patient, examined her. I agree with the assessment and and plan and have edited the notes.   Ms Schlesinger has developed recurrent nausea and vomiting in the past 5 to 6 days, has not been able to tolerate much oral intake.  She is also constipated, but did have two bowel movement with laxatives.  No fever or chills.  Abdominal exam showed moderate tenderness in the epigastric area, no rebound.  I reviewed her lab and abdominal x-ray findings with patient and her daughter in detail. I suspect she has gastric outlet obstruction from periportal adenopathy from her metastatic gallbladder cancer.  I recommend CT abdomen with contrast for further evaluation.  I discussed the case with [facility] Dr. Watt Climes. He has kindly agree to do EGD and see if she is a candidate for duodenal stent placement, probably on Monday 10/19. I also discussed other options, such as PEG and J-tube placement by IR or surgery for nutrition and gastric emptying.  Patient does not wish to pursue aggressive measurements, such as surgery.  She agrees with EGD and CT scan.  Pt is self pay, hesitated to be admitted to hospital due to financial concerns. I instructed her to change to liquid diet only over the weekend, and try reglan, and other antiemesis (compazine and zofran), and suppository laxatives. Refill her hydrocodone. Pt and her daughter knows to call me if she has worsening symptoms at home.   I have discussed the incurable nature of her metastatic gallbladder cancer with her daughter, and overall poor prognosis. She also has untreated left breast cancer.  Her life expectancy is likely going to be a few weeks if we can not resolve her gastric outlet obstruction. Given her good quality of life until a  week ago, pt and her daughter agreed to proceed further workup and EGD, and I will try to manage it as outpt. We will see her back next Monday 10/19. Pt and her daughter were very appreciative to our care.   Truitt Merle  09/06/2019

## 2019-09-06 NOTE — Telephone Encounter (Signed)
Per Dr. Burr Medico Pt. Scheduled for symptom management today.Per Dr. Burr Medico Pt  can be accompanied by Daughter for translation. daughter states upon translation from Pt  that she has nausea and vomiting that stops right at her throat. Daughter also states that she has not been able to eat or drink, Daughter also states that her mother had an episode of irregular heart beat which she also complains of tightness right under her diaphragm. Sandi Mealy PA informed of complaints.Pt sent to x-ray  Pt. To receive IV fluids decadron and zofran ordered by Sandi Mealy PA, 22g IV placed in left hand, IV fluids given as well as decadron 5 mg and zofran 4 mg. Pt. Tolerated IV fluids well VS stable IV removed with tip intact. Pt. discharged from symptom management. Accompanied by daughter in a wheelchair.

## 2019-09-06 NOTE — Patient Instructions (Signed)

## 2019-09-07 LAB — SARS CORONAVIRUS 2 (TAT 6-24 HRS): SARS Coronavirus 2: NEGATIVE

## 2019-09-08 NOTE — Anesthesia Preprocedure Evaluation (Addendum)
Anesthesia Evaluation  Patient identified by MRN, date of birth, ID band Patient awake    Reviewed: Allergy & Precautions, NPO status , Patient's Chart, lab work & pertinent test results  Airway Mallampati: II  TM Distance: >3 FB Neck ROM: Full    Dental no notable dental hx. (+) Upper Dentures, Lower Dentures   Pulmonary neg pulmonary ROS,    Pulmonary exam normal breath sounds clear to auscultation       Cardiovascular Exercise Tolerance: Good negative cardio ROS Normal cardiovascular exam Rhythm:Regular Rate:Normal     Neuro/Psych negative neurological ROS  negative psych ROS   GI/Hepatic Neg liver ROS,   Endo/Other    Renal/GU negative Renal ROS     Musculoskeletal negative musculoskeletal ROS (+)   Abdominal   Peds  Hematology negative hematology ROS (+)   Anesthesia Other Findings Hx of Gall bladder cancer with conservative management.  Reproductive/Obstetrics                            Anesthesia Physical Anesthesia Plan  ASA: III  Anesthesia Plan: MAC   Post-op Pain Management:    Induction: Intravenous  PONV Risk Score and Plan: 3 and Treatment may vary due to age or medical condition  Airway Management Planned: Nasal Cannula and Natural Airway  Additional Equipment: None  Intra-op Plan:   Post-operative Plan:   Informed Consent:     Dental advisory given  Plan Discussed with: CRNA  Anesthesia Plan Comments: (EGD w esophageal dilation)       Anesthesia Quick Evaluation

## 2019-09-09 ENCOUNTER — Ambulatory Visit (HOSPITAL_COMMUNITY): Payer: Self-pay | Admitting: Anesthesiology

## 2019-09-09 ENCOUNTER — Other Ambulatory Visit: Payer: Self-pay | Admitting: Medical

## 2019-09-09 ENCOUNTER — Ambulatory Visit (HOSPITAL_COMMUNITY)
Admission: RE | Admit: 2019-09-09 | Discharge: 2019-09-09 | Disposition: A | Discharge status: Home / Self Care | Payer: Self-pay | Attending: Gastroenterology | Admitting: Gastroenterology

## 2019-09-09 ENCOUNTER — Ambulatory Visit (HOSPITAL_COMMUNITY): Payer: Self-pay

## 2019-09-09 ENCOUNTER — Encounter (HOSPITAL_COMMUNITY): Payer: Self-pay | Admitting: *Deleted

## 2019-09-09 ENCOUNTER — Other Ambulatory Visit: Payer: Self-pay

## 2019-09-09 ENCOUNTER — Encounter (HOSPITAL_COMMUNITY)
Admission: RE | Disposition: A | Discharge status: Home / Self Care | Payer: Self-pay | Source: Home / Self Care | Attending: Gastroenterology

## 2019-09-09 ENCOUNTER — Inpatient Hospital Stay (HOSPITAL_BASED_OUTPATIENT_CLINIC_OR_DEPARTMENT_OTHER): Payer: Self-pay | Admitting: Medical

## 2019-09-09 ENCOUNTER — Ambulatory Visit (HOSPITAL_COMMUNITY)
Admission: RE | Admit: 2019-09-09 | Discharge: 2019-09-09 | Disposition: A | Discharge status: Home / Self Care | Payer: Self-pay | Source: Ambulatory Visit | Attending: Medical | Admitting: Medical

## 2019-09-09 VITALS — BP 97/68 | HR 80 | Temp 98.3°F | Resp 17

## 2019-09-09 DIAGNOSIS — R112 Nausea with vomiting, unspecified: Secondary | ICD-10-CM | POA: Insufficient documentation

## 2019-09-09 DIAGNOSIS — R140 Abdominal distension (gaseous): Secondary | ICD-10-CM | POA: Insufficient documentation

## 2019-09-09 DIAGNOSIS — E860 Dehydration: Secondary | ICD-10-CM

## 2019-09-09 DIAGNOSIS — R1013 Epigastric pain: Secondary | ICD-10-CM | POA: Insufficient documentation

## 2019-09-09 DIAGNOSIS — C23 Malignant neoplasm of gallbladder: Secondary | ICD-10-CM | POA: Insufficient documentation

## 2019-09-09 DIAGNOSIS — K3189 Other diseases of stomach and duodenum: Secondary | ICD-10-CM | POA: Insufficient documentation

## 2019-09-09 DIAGNOSIS — Z8509 Personal history of malignant neoplasm of other digestive organs: Secondary | ICD-10-CM | POA: Insufficient documentation

## 2019-09-09 DIAGNOSIS — K2100 Gastro-esophageal reflux disease with esophagitis, without bleeding: Secondary | ICD-10-CM | POA: Insufficient documentation

## 2019-09-09 DIAGNOSIS — Z9049 Acquired absence of other specified parts of digestive tract: Secondary | ICD-10-CM | POA: Insufficient documentation

## 2019-09-09 DIAGNOSIS — C50912 Malignant neoplasm of unspecified site of left female breast: Secondary | ICD-10-CM | POA: Insufficient documentation

## 2019-09-09 DIAGNOSIS — K311 Adult hypertrophic pyloric stenosis: Secondary | ICD-10-CM

## 2019-09-09 DIAGNOSIS — K315 Obstruction of duodenum: Secondary | ICD-10-CM | POA: Insufficient documentation

## 2019-09-09 DIAGNOSIS — D490 Neoplasm of unspecified behavior of digestive system: Secondary | ICD-10-CM | POA: Insufficient documentation

## 2019-09-09 HISTORY — PX: ESOPHAGOGASTRODUODENOSCOPY: SHX5428

## 2019-09-09 HISTORY — PX: DUODENAL STENT PLACEMENT: SHX5541

## 2019-09-09 LAB — GLUCOSE, CAPILLARY: Glucose-Capillary: 269 mg/dL — ABNORMAL HIGH (ref 70–99)

## 2019-09-09 SURGERY — EGD (ESOPHAGOGASTRODUODENOSCOPY)
Anesthesia: Monitor Anesthesia Care

## 2019-09-09 MED ORDER — LIDOCAINE 2% (20 MG/ML) 5 ML SYRINGE
INTRAMUSCULAR | Status: DC | PRN
  Administered 2019-09-09: 40 mg via INTRAVENOUS

## 2019-09-09 MED ORDER — SODIUM CHLORIDE (PF) 0.9 % IJ SOLN
INTRAMUSCULAR | Status: AC
  Filled 2019-09-09: qty 50

## 2019-09-09 MED ORDER — PROPOFOL 500 MG/50ML IV EMUL
INTRAVENOUS | Status: DC | PRN
  Administered 2019-09-09: 50 ug/kg/min via INTRAVENOUS

## 2019-09-09 MED ORDER — DEXTROSE-NACL 5-0.45 % IV SOLN
INTRAVENOUS | Status: AC
  Administered 2019-09-09: 12:00:00 via INTRAVENOUS

## 2019-09-09 MED ORDER — ONDANSETRON HCL 4 MG/2ML IJ SOLN
INTRAMUSCULAR | Status: AC
  Filled 2019-09-09: qty 2

## 2019-09-09 MED ORDER — SODIUM CHLORIDE 0.9 % IV SOLN: INTRAVENOUS | Status: DC

## 2019-09-09 MED ORDER — ONDANSETRON HCL 4 MG/2ML IJ SOLN
4.0000 mg | Freq: Once | INTRAMUSCULAR | Status: AC
  Administered 2019-09-09: 10:00:00 4 mg via INTRAVENOUS

## 2019-09-09 MED ORDER — IOHEXOL 300 MG/ML  SOLN
100.0000 mL | Freq: Once | INTRAMUSCULAR | Status: AC | PRN
  Administered 2019-09-09: 100 mL via INTRAVENOUS

## 2019-09-09 MED ORDER — SODIUM CHLORIDE 0.9 % IV SOLN: 5.0000 mg | Freq: Once | INTRAVENOUS | Status: DC

## 2019-09-09 MED ORDER — PROPOFOL 500 MG/50ML IV EMUL
INTRAVENOUS | Status: AC
  Filled 2019-09-09: qty 50

## 2019-09-09 MED ORDER — DEXAMETHASONE SODIUM PHOSPHATE 10 MG/ML IJ SOLN
5.0000 mg | Freq: Once | INTRAMUSCULAR | Status: AC
  Administered 2019-09-09: 10:00:00 5 mg via INTRAVENOUS

## 2019-09-09 MED ORDER — SODIUM CHLORIDE 0.9 % IV SOLN
Freq: Once | INTRAVENOUS | Status: AC
  Administered 2019-09-09: 10:00:00 via INTRAVENOUS
  Filled 2019-09-09: qty 250

## 2019-09-09 MED ORDER — DEXAMETHASONE SODIUM PHOSPHATE 10 MG/ML IJ SOLN
INTRAMUSCULAR | Status: AC
  Filled 2019-09-09: qty 1

## 2019-09-09 MED ORDER — ONDANSETRON HCL 4 MG/2ML IJ SOLN
INTRAMUSCULAR | Status: DC | PRN
  Administered 2019-09-09: 4 mg via INTRAVENOUS

## 2019-09-09 MED ORDER — LACTATED RINGERS IV SOLN
INTRAVENOUS | Status: DC
  Administered 2019-09-09: 15:00:00 via INTRAVENOUS

## 2019-09-09 NOTE — Consult Note (Signed)
Reason for Consult: Duodenal obstruction Referring Physician: Oncology  Rachel Hughes is an 85 y.o. female.  HPI: Patient seen and examined and her case discussed with her oncologist on multiple occasions as well as her daughter and they have requested a palliative duodenal stent and right now she does have some pain with nausea and vomiting in her hospital computer chart was reviewed she has no new complaints  History reviewed. No pertinent past medical history.  Past Surgical History:  Procedure Laterality Date  . CESAREAN SECTION    . CHOLECYSTECTOMY N/A 06/24/2019   Procedure: LAPAROSCOPIC CHOLECYSTECTOMY WITH INTRAOPERATIVE CHOLANGIOGRAM WITH LYMPH NODE BIOPSY;  Surgeon: Michael Boston, MD;  Location: [facility];  Service: General;  Laterality: N/A;  . FRACTURE SURGERY    . Left Hip Surgery Left     Family History  Problem Relation Age of Onset  . Arthritis Daughter   . Arthritis Son   . Heart disease Son     Social History:  reports that she has never smoked. She has never used smokeless tobacco. She reports that she does not drink alcohol or use drugs.  Allergies: No Known Allergies  Medications: I have reviewed the patient's current medications.  No results found for this or any previous visit (from the past 48 hour(s)).  Ct Abdomen Pelvis W Contrast  Result Date: 09/09/2019 CLINICAL DATA:  RLQ pain for 3 weeks, getting worse; hx gb removal and c/section; LT breast cancer diagnosed 06/20/19 and gb cancer diagnosed 06/20/19, surgery on GB, no treatment for breast/ eval for gastric outlet obstruction Nausea, vomiting H/O gallbladder cancer with abdominal pain, epigastric tenderness and vomiting. R/O gastric outlet obstruction. EXAM: CT ABDOMEN AND PELVIS WITH CONTRAST TECHNIQUE: Multidetector CT imaging of the abdomen and pelvis was performed using the standard protocol following bolus administration of intravenous contrast. CONTRAST:  147mL OMNIPAQUE IOHEXOL 300 MG/ML  SOLN COMPARISON:   CT chest 06/22/2019, CT abdomen 06/21/2019 FINDINGS: Lower chest: 5 mm LEFT lobe pulmonary nodule (image 21/76) is not changed from comparison exam. Hepatobiliary: No focal hepatic lesion. No intrahepatic biliary duct dilatation. Post cholecystectomy. Within the porta hepatis there is a large necrotic mass measuring 5.7 x 4.5 by 5.7 cm. This mass in the porta hepatis has a surgical clip centrally within the mass related to recent cholecystectomy. There are additional smaller nodal masses in the porta hepatis. For example 1.9 cm node dorsal to the portal vein. 1.2 cm node between the pancreas and RIGHT hepatic lobe of the liver. The dominant mass in the porta hepatis distorts and compresses the second portion the duodenum and essentially obliterates the lumen. There is retention of gastric contents within the stomach and gastric antrum leading up to the duodenal bulb. The more distal second portion duodenum and third portion duodenum normal. Pancreas: The pancreas is grossly normal without evidence of inflammation, mass or obstruction. Spleen: Normal spleen Adrenals/urinary tract: Adrenal glands normal. Large simple cyst of the LEFT kidney measures 3.4 cm. Stomach/Bowel: Stomach again is distended by gastric contents. Large necrotic mass in the porta hepatis externally compresses the second portion duodenum with no clear lumen identified. Lesion seen on coronal image 34/5 and axial image 33/2 in described in the liver section. Vascular/Lymphatic: Metastatic lymph node LEFT aorta measuring 0.8 cm short axis is increased from 5 mm on prior. Reproductive: Uterus and ovaries normal. Other: Trace free fluid. Musculoskeletal: In the LEFT chest wall inferior to the breast 2 adjacent enhancing nodules measure 2.6 and 1.7 cm. These are very similar  to comparison exam. IMPRESSION: 1. Large necrotic mass in the porta hepatis compresses the second portion the duodenum. There is evidence of mechanical obstruction with retention  of food stuff within the stomach. 2. Enlargement of periportal of and retroperitoneal periaortic lymph nodes consistent with progression of metastatic disease. 3. Stable masses in the LEFT breast. These results will be called to the ordering clinician or representative by the Radiologist Assistant, and communication documented in the PACS or zVision Dashboard. Electronically Signed   By: Suzy Bouchard M.D.   On: 09/09/2019 10:03    ROS negative except above Blood pressure 123/72, pulse 78, temperature 98.1 F (36.7 C), temperature source Oral, resp. rate 13, height 5' (1.524 m), weight 55.8 kg, SpO2 98 %. Physical Exam vital signs stable afebrile no acute distress exam please see preassessment evaluation labs and multiple CTs reviewed  Assessment/Plan: Duodenal obstruction from metastatic cancer Plan: The risks benefits methods and success rate of duodenal stent was discussed with the patient and her daughter and we also talked about in the future possibly needing a biliary stent but will proceed with the duodenal 1 only for now with further work-up plans pending those findings  Rachel Hughes E 09/09/2019, 1:57 PM

## 2019-09-09 NOTE — Op Note (Signed)
[facility] Patient Name: Rachel Hughes Procedure Date: 09/09/2019 MRN: [id number] Attending MD: Clarene Essex , MD Date of Birth: 09/26/1933 CSN: [id number] Age: 85 Admit Type: Outpatient Procedure:                Upper GI endoscopy Indications:              For palliative stenting of stenosing neoplasm of                            the duodenum, For therapy of duodenal stenosis,                            Abnormal CT of the GI tract Providers:                Clarene Essex, MD, Cleda Daub, RN, Lazaro Arms,                            Technician, Marguerita Merles, Technician Referring MD:              Medicines:                Propofol per Anesthesia Complications:            No immediate complications. Estimated Blood Loss:     Estimated blood loss: none. Procedure:                Pre-Anesthesia Assessment:                           - Prior to the procedure, a History and Physical                            was performed, and patient medications and                            allergies were reviewed. The patient's tolerance of                            previous anesthesia was also reviewed. The risks                            and benefits of the procedure and the sedation                            options and risks were discussed with the patient.                            All questions were answered, and informed consent                            was obtained. Prior Anticoagulants: The patient has                            taken no previous anticoagulant or antiplatelet  agents. ASA Grade Assessment: III - A patient with                            severe systemic disease. After reviewing the risks                            and benefits, the patient was deemed in                            satisfactory condition to undergo the procedure.                           After obtaining informed consent, the endoscope was   passed under direct vision. Throughout the                            procedure, the patient's blood pressure, pulse, and                            oxygen saturations were monitored continuously. The                            [id number] Olympus therapeutic endoscope                            was introduced through the mouth, and advanced to                            the second part of duodenum. The upper GI endoscopy                            was technically difficult and complex due to                            abnormal anatomy. Successful completion of the                            procedure was aided by performing the maneuvers                            documented (below) in this report. The patient                            tolerated the procedure well. Scope In: Scope Out: Findings:      LA Grade A (one or more mucosal breaks less than 5 mm, not extending       between tops of 2 mucosal folds) esophagitis with no bleeding was found.      Retained fluid was found in the gastric body. Fluid aspiration was       performed.      An Extrinsic compression from metastatic cancer malignant-appearing,       intrinsic severe stenosis was found in the duodenal bulb and in the       first portion of the duodenum and was traversed. This was stented with  a       22 mm x 9 cm WallFlex stent under fluoroscopic guidance over the long       Jagwire in the customary fashion and the stent extended through the       pylorus with a nice waist on fluoroscopy at the site of obstruction.      The exam was otherwise without abnormality. Impression:               - LA Grade A reflux esophagitis.                           - Retained gastric fluid. Fluid aspiration                            performed.                           - Extrinsic compression from metastatic cancer                            duodenal stenosis. Prosthesis placed.                           - The examination was  otherwise normal. Moderate Sedation:      Not Applicable - Patient had care per Anesthesia. Recommendation:           - Patient has a contact number available for                            emergencies. The signs and symptoms of potential                            delayed complications were discussed with the                            patient. Return to normal activities tomorrow.                            Written discharge instructions were provided to the                            patient.                           - Clear liquid diet today. If doing okay tomorrow                            soft solids and pured food and plenty of liquids                            and probably 6 small meals a day                           - Continue present medications.                           -  Return to GI clinic PRN.                           - Telephone GI clinic if symptomatic PRN. Procedure Code(s):        --- Professional ---                           [phone], Esophagogastroduodenoscopy, flexible,                            transoral; with placement of endoscopic stent                            (includes pre- and post-dilation and guide wire                            passage, when performed) Diagnosis Code(s):        --- Professional ---                           K21.0, Gastro-esophageal reflux disease with                            esophagitis                           K31.5, Obstruction of duodenum                           D49.0, Neoplasm of unspecified behavior of                            digestive system                           R93.3, Abnormal findings on diagnostic imaging of                            other parts of digestive tract CPT copyright 2019 [employer]. All rights reserved. The codes documented in this report are preliminary and upon coder review may  be revised to meet current compliance requirements. Clarene Essex, MD 09/09/2019 3:51:08 PM This  report has been signed electronically. Number of Addenda: 0

## 2019-09-09 NOTE — Patient Instructions (Addendum)
Dehydration, Adult  Dehydration is when there is not enough fluid or water in your body. This happens when you lose more fluids than you take in. Dehydration can range from mild to very bad. It should be treated right away to keep it from getting very bad. Symptoms of mild dehydration may include:  Thirst.  Dry lips.  Slightly dry mouth.  Dry, warm skin.  Dizziness. Symptoms of moderate dehydration may include:  Very dry mouth.  Muscle cramps.  Dark pee (urine). Pee may be the color of tea.  Your body making less pee.  Your eyes making fewer tears.  Heartbeat that is uneven or faster than normal (palpitations).  Headache.  Light-headedness, especially when you stand up from sitting.  Fainting (syncope). Symptoms of very bad dehydration may include:  Changes in skin, such as: ? Cold and clammy skin. ? Blotchy (mottled) or pale skin. ? Skin that does not quickly return to normal after being lightly pinched and let go (poor skin turgor).  Changes in body fluids, such as: ? Feeling very thirsty. ? Your eyes making fewer tears. ? Not sweating when body temperature is high, such as in hot weather. ? Your body making very little pee.  Changes in vital signs, such as: ? Weak pulse. ? Pulse that is more than 100 beats a minute when you are sitting still. ? Fast breathing. ? Low blood pressure.  Other changes, such as: ? Sunken eyes. ? Cold hands and feet. ? Confusion. ? Lack of energy (lethargy). ? Trouble waking up from sleep. ? Short-term weight loss. ? Unconsciousness. Follow these instructions at home:   If told by your doctor, drink an ORS: ? Make an ORS by using instructions on the package. ? Start by drinking small amounts, about  cup (120 mL) every 5-10 minutes. ? Slowly drink more until you have had the amount that your doctor said to have.  Drink enough clear fluid to keep your pee clear or pale yellow. If you were told to drink an ORS, finish the  ORS first, then start slowly drinking clear fluids. Drink fluids such as: ? Water. Do not drink only water by itself. Doing that can make the salt (sodium) level in your body get too low (hyponatremia). ? Ice chips. ? Fruit juice that you have added water to (diluted). ? Low-calorie sports drinks.  Avoid: ? Alcohol. ? Drinks that have a lot of sugar. These include high-calorie sports drinks, fruit juice that does not have water added, and soda. ? Caffeine. ? Foods that are greasy or have a lot of fat or sugar.  Take over-the-counter and prescription medicines only as told by your doctor.  Do not take salt tablets. Doing that can make the salt level in your body get too high (hypernatremia).  Eat foods that have minerals (electrolytes). Examples include bananas, oranges, potatoes, tomatoes, and spinach.  Keep all follow-up visits as told by your doctor. This is important. Contact a doctor if:  You have belly (abdominal) pain that: ? Gets worse. ? Stays in one area (localizes).  You have a rash.  You have a stiff neck.  You get angry or annoyed more easily than normal (irritability).  You are more sleepy than normal.  You have a harder time waking up than normal.  You feel: ? Weak. ? Dizzy. ? Very thirsty.  You have peed (urinated) only a small amount of very dark pee during 6-8 hours. Get help right away if:  You have   symptoms of very bad dehydration.  You cannot drink fluids without throwing up (vomiting).  Your symptoms get worse with treatment.  You have a fever.  You have a very bad headache.  You are throwing up or having watery poop (diarrhea) and it: ? Gets worse. ? Does not go away.  You have blood or something green (bile) in your throw-up.  You have blood in your poop (stool). This may cause poop to look black and tarry.  You have not peed in 6-8 hours.  You pass out (faint).  Your heart rate when you are sitting still is more than 100 beats a  minute.  You have trouble breathing. This information is not intended to replace advice given to you by your health care provider. Make sure you discuss any questions you have with your health care provider. Document Released: 09/03/2009 Document Revised: 10/20/2017 Document Reviewed: 01/01/2016 Elsevier Patient Education  2020 [employer]vier Inc.  

## 2019-09-09 NOTE — Progress Notes (Signed)
Daughter Sonny Masters present for [facility] visit today and acting as pt preferred translator.  Pt declines hospital provided translator at this time.   1123- POC blood glucose performed before starting D51/2NS infusion.  Result of 103.  PA Lucianne Lei aware, ok to continue w/tx as planned.  Tolerated IVF and antiemetics well.  Pt transferred to endo bay 2, report given to RN by PA Lucianne Lei.  Daughter Sonny Masters present for transfer.

## 2019-09-09 NOTE — Anesthesia Postprocedure Evaluation (Signed)
Anesthesia Post Note  Patient: Rachel Hughes  Procedure(s) Performed: ESOPHAGOGASTRODUODENOSCOPY (EGD) (N/A ) DUODENAL STENT PLACEMENT (N/A )     Patient location during evaluation: Endoscopy Anesthesia Type: MAC Level of consciousness: awake and alert Pain management: pain level controlled Vital Signs Assessment: post-procedure vital signs reviewed and stable Respiratory status: spontaneous breathing, nonlabored ventilation, respiratory function stable and patient connected to nasal cannula oxygen Cardiovascular status: blood pressure returned to baseline and stable Postop Assessment: no apparent nausea or vomiting Anesthetic complications: no    Last Vitals:  Vitals:   09/09/19 1553 09/09/19 1600  BP: 114/61 118/64  Pulse: 81 76  Resp: (!) 22 (!) 22  Temp: 36.6 C   SpO2: 98% 91%    Last Pain:  Vitals:   09/09/19 1600  TempSrc:   PainSc: 0-No pain                 Barnet Glasgow

## 2019-09-09 NOTE — Progress Notes (Signed)
These preliminary result these preliminary results were noted.  Awaiting final report.

## 2019-09-09 NOTE — Transfer of Care (Signed)
Immediate Anesthesia Transfer of Care Note  Patient: Rachel Hughes  Procedure(s) Performed: ESOPHAGOGASTRODUODENOSCOPY (EGD) (N/A ) DUODENAL STENT PLACEMENT (N/A )  Patient Location: PACU  Anesthesia Type:MAC  Level of Consciousness: awake, alert , oriented and patient cooperative  Airway & Oxygen Therapy: Patient Spontanous Breathing and Patient connected to face mask oxygen  Post-op Assessment: Report given to RN, Post -op Vital signs reviewed and stable and Patient moving all extremities  Post vital signs: Reviewed and stable  Last Vitals:  Vitals Value Taken Time  BP 114/61 09/09/19 1552  Temp    Pulse 84 09/09/19 1554  Resp 18 09/09/19 1554  SpO2 97 % 09/09/19 1554  Vitals shown include unvalidated device data.  Last Pain:  Vitals:   09/09/19 1344  TempSrc: Oral  PainSc: 0-No pain         Complications: No apparent anesthesia complications

## 2019-09-09 NOTE — Discharge Instructions (Signed)
YOU HAD AN ENDOSCOPIC PROCEDURE TODAY: Refer to the procedure report and other information in the discharge instructions given to you for any specific questions about what was found during the examination. If this information does not answer your questions, please call [facility] office at [phone] to clarify.   YOU SHOULD EXPECT: Some feelings of bloating in the abdomen. Passage of more gas than usual. Walking can help get rid of the air that was put into your GI tract during the procedure and reduce the bloating. If you had a lower endoscopy (such as a colonoscopy or flexible sigmoidoscopy) you may notice spotting of blood in your stool or on the toilet paper. Some abdominal soreness may be present for a day or two, also.  DIET: Your first meal following the procedure should be a light meal and then it is ok to progress to your normal diet. A half-sandwich or bowl of soup is an example of a good first meal. Heavy or fried foods are harder to digest and may make you feel nauseous or bloated. Drink plenty of fluids but you should avoid alcoholic beverages for 24 hours. If you had a esophageal dilation, please see attached instructions for diet.   ACTIVITY: Your care partner should take you home directly after the procedure. You should plan to take it easy, moving slowly for the rest of the day. You can resume normal activity the day after the procedure however YOU SHOULD NOT DRIVE, use power tools, machinery or perform tasks that involve climbing or major physical exertion for 24 hours (because of the sedation medicines used during the test).   SYMPTOMS TO REPORT IMMEDIATELY: A gastroenterologist can be reached at any hour. Please call [phone]  for any of the following symptoms:   Following upper endoscopy (EGD, EUS, ERCP, esophageal dilation) Vomiting of blood or coffee ground material  New, significant abdominal pain  New, significant chest pain or pain under the shoulder blades  Painful or  persistently difficult swallowing  New shortness of breath  Black, tarry-looking or red, bloody stools  FOLLOW UP:  If any biopsies were taken you will be contacted by phone or by letter within the next 1-3 weeks. Call [phone]  if you have not heard about the biopsies in 3 weeks.  Please also call with any specific questions about appointments or follow up tests. Clear liquid diet today and if okay tomorrow may have soft solids and pured food and probably would do best with 6 small meals a day with plenty of liquids and stay upright for 2 hours after eating and call if question or problem from a GI standpoint otherwise follow-up with oncology per their routine

## 2019-09-10 ENCOUNTER — Encounter (HOSPITAL_COMMUNITY): Payer: Self-pay | Admitting: Gastroenterology

## 2019-09-10 LAB — GLUCOSE, CAPILLARY: Glucose-Capillary: 103 mg/dL — ABNORMAL HIGH (ref 70–99)

## 2019-09-10 NOTE — Progress Notes (Signed)
These preliminary result these preliminary results were noted.  Awaiting final report.

## 2019-09-10 NOTE — Progress Notes (Signed)
The patient is an 85 year old female with a history of a metastatic gallbladder cancer.  She is managed by Dr. Truitt Merle and is followed conservatively without chemotherapy.  She presents to the office today for a CT scan.  She has had a history of epigastric pain for the last 3 weeks.  This has been worsening.  She has had episodes of nausea and vomiting.  She also has had anorexia.  She reports a sensation of feeling as though food and water backing up into her esophagus.  She has been belching but reports that this causes a foul odor.  She had 1 small bowel movement recently.  She is passing little flatus.  Her CT scan this morning returned showing a large necrotic mass in the porta hepatis which compresses the second portion of the duodenum.  There is evidence of mechanical obstruction with retention of food stuff within the stomach.  Also is noted was enlargement of.  Portal and retroperitoneal periaortic lymph nodes consistent with disease progression.  A stable mass was noted in the left breast.  The patient is receiving IV fluids while she awaits an appointment with endoscopy today for consideration of a stent placement for palliation.  She had a Covid test collected on 09/06/2019 which returned negative.  The patient was seen today with Dr. Truitt Merle.  Sandi Mealy, MHS, PA-C Physician Assistant

## 2019-09-24 ENCOUNTER — Other Ambulatory Visit: Payer: Self-pay | Admitting: Hematology

## 2019-09-29 ENCOUNTER — Telehealth: Payer: Self-pay | Admitting: Hematology

## 2019-09-29 ENCOUNTER — Other Ambulatory Visit: Payer: Self-pay | Admitting: Hematology

## 2019-09-29 MED ORDER — OXYCODONE HCL 5 MG/5ML PO SOLN: 5.0000 mg | ORAL | 0 refills | Status: AC | PRN

## 2019-09-29 NOTE — Telephone Encounter (Signed)
Pt's daughter called and updated me. I previously discussed with her and they have decided not to pursue any treatment except comfort care at home. Pt has had recurrent nausea and vomiting a few days ago, not able to eat much, limited liquid intake. She was very drowsy yesterday and today and slept most of the time. Her abdominal pain has gotten worse also, but she is not able to keep oxycodone pill down. I called in liquid oxycodone to [facility] on [address] today, not able to find sublingual form, will check with [facility] pharmacy tomorrow. I also discussed hospice service, pt is self-pay, her daughter has been able to manage her symptoms at home, and will let me know if she decides to use hospice service. She knows to call me if anything I can help her and her mother.   Truitt Merle  09/29/2019

## 2019-10-02 ENCOUNTER — Telehealth: Payer: Self-pay

## 2019-10-02 ENCOUNTER — Other Ambulatory Visit: Payer: Self-pay

## 2019-10-02 DIAGNOSIS — C23 Malignant neoplasm of gallbladder: Secondary | ICD-10-CM

## 2019-10-02 NOTE — Telephone Encounter (Signed)
Referral made to [facility], demographics and OV notes were faxed to their office.

## 2019-10-02 NOTE — Progress Notes (Signed)
ambu

## 2019-10-04 ENCOUNTER — Telehealth: Payer: Self-pay

## 2019-10-04 NOTE — Telephone Encounter (Signed)
Faxed signed orders back to [facility], sent to [facility] for scan to chart.

## 2019-10-22 DEATH — deceased

## 2020-09-17 IMAGING — CT CT ABDOMEN AND PELVIS WITH CONTRAST
2 of 5 series · 15 of 46 positions shown, 17 images · IV contrast (ISOVUE)
Comparison: None.

CLINICAL DATA: Abdominal pain.  Abscess suspected.

EXAM:
CT ABDOMEN AND PELVIS WITH CONTRAST
TECHNIQUE: Multidetector CT imaging of the abdomen and pelvis was performed
using the standard protocol following bolus administration of
intravenous contrast.
CONTRAST:  100mL OMNIPAQUE IOHEXOL 300 MG/ML  SOLN

[Series 2: axial st · axial · 0.77mm/px · z∈[+1055,+1465]mm · 12 of 96 slices shown, 14 images]
[im 7/96  soft-tissue]
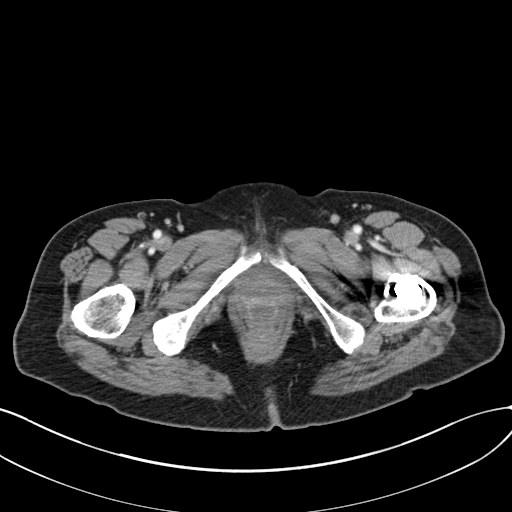
[im 7/96  bone]
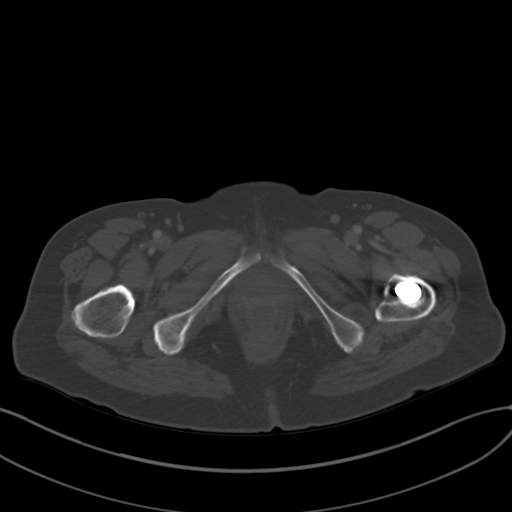
[im 14/96  soft-tissue]
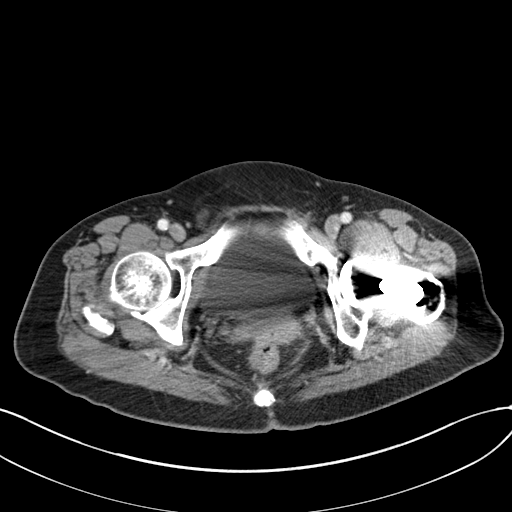
[im 21/96  soft-tissue]
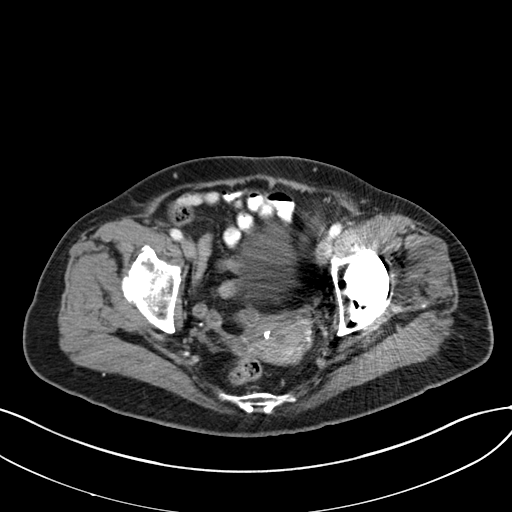
[im 28/96  soft-tissue]
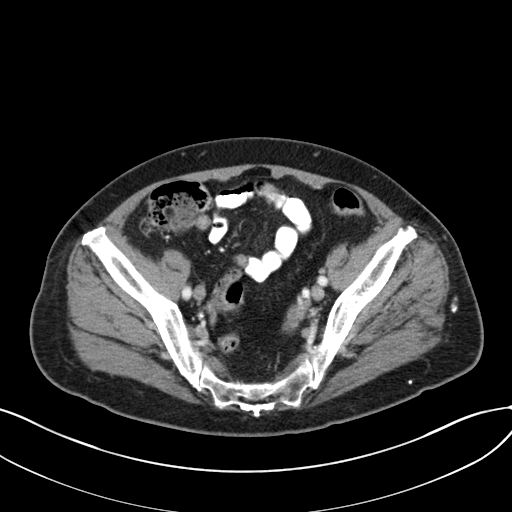
[im 34/96  soft-tissue]
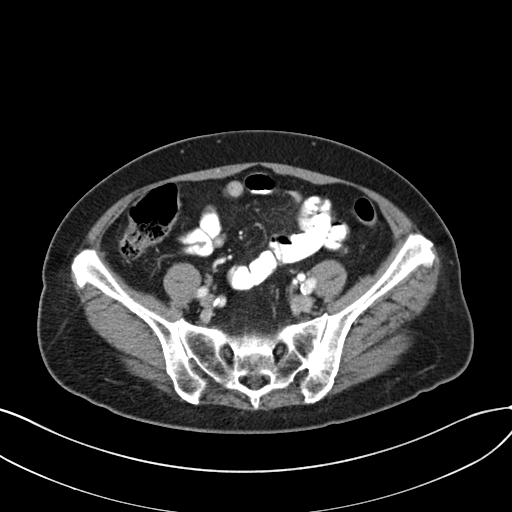
[im 41/96  soft-tissue]
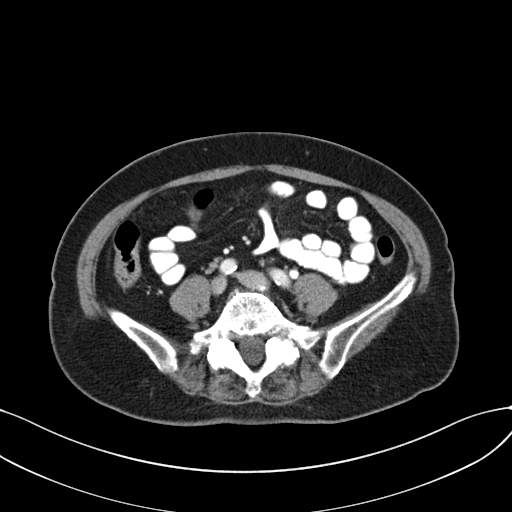
[im 55/96  soft-tissue]
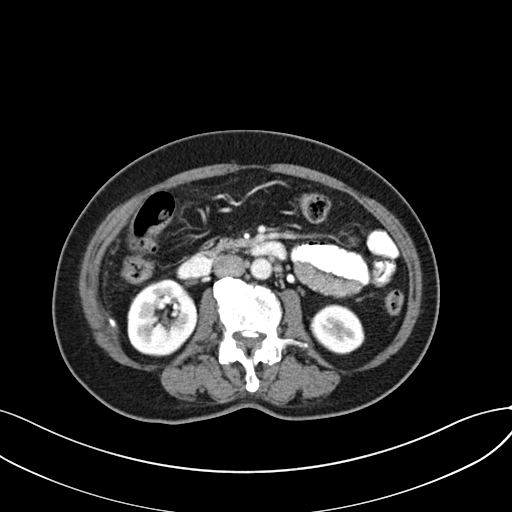
[im 62/96  soft-tissue]
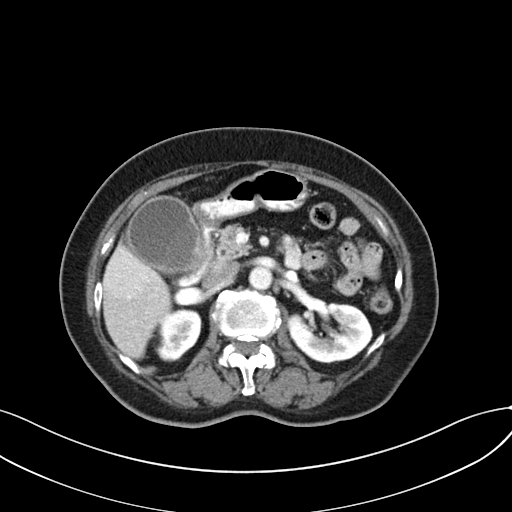
[im 68/96  soft-tissue]
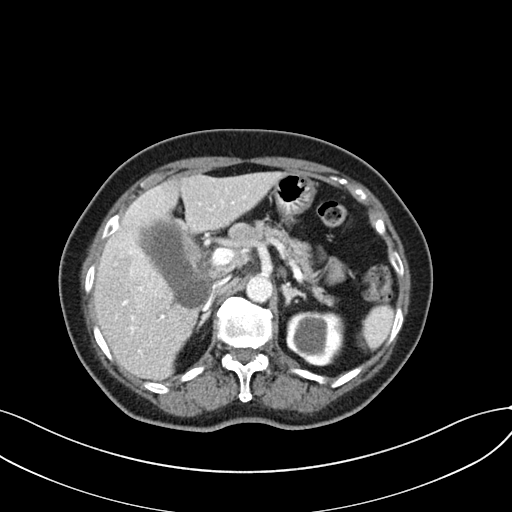
[im 68/96  bone]
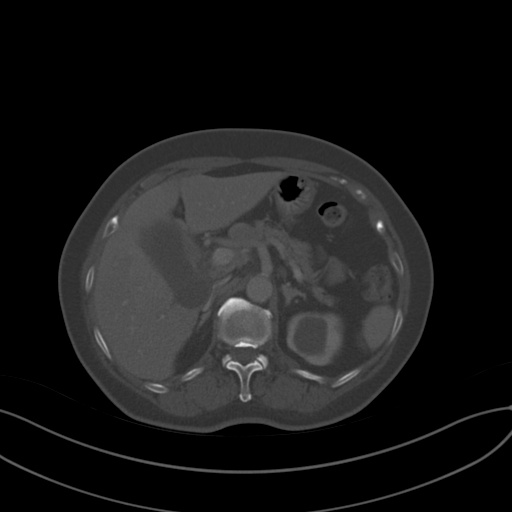
[im 75/96  soft-tissue]
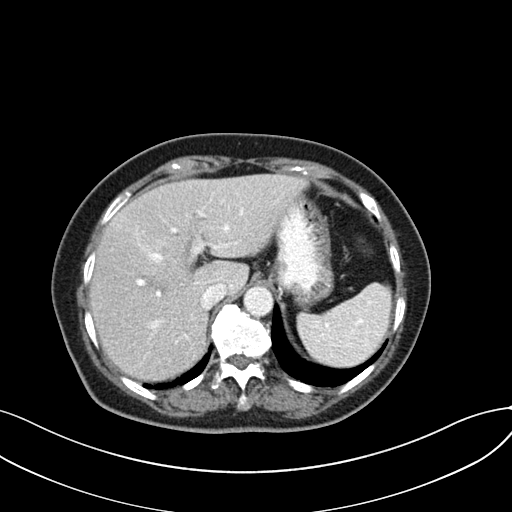
[im 82/96  soft-tissue]
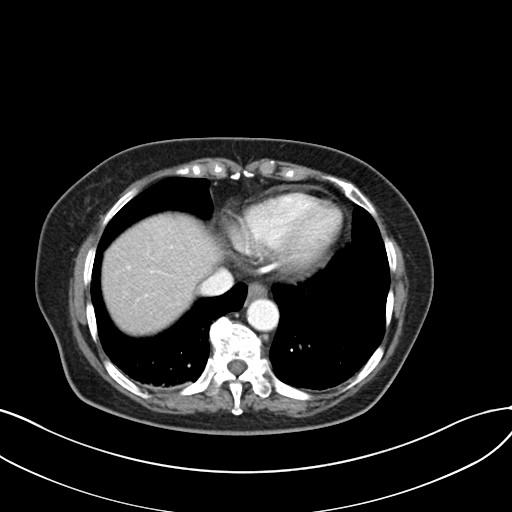
[im 89/96  soft-tissue]
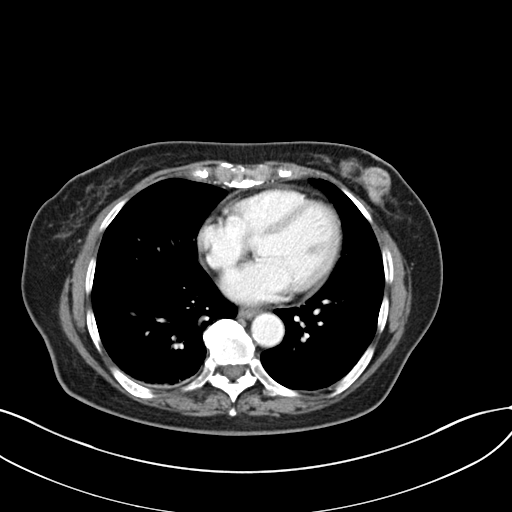

[Series 5: coronal st · coronal · 0.71mm/px · 3 of 127 slices shown]
[im 43/127  soft-tissue]
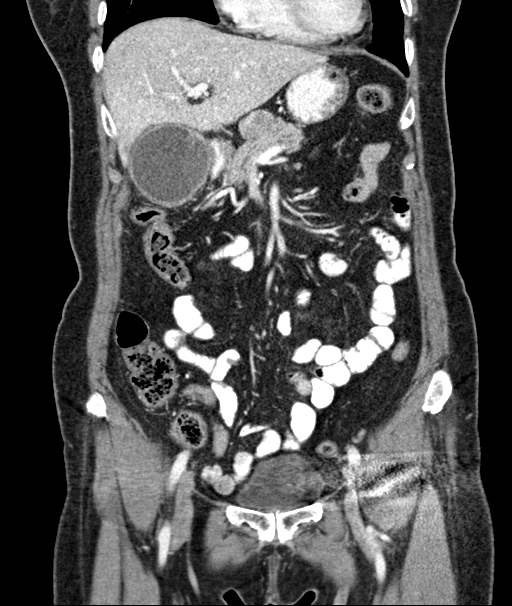
[im 57/127  soft-tissue]
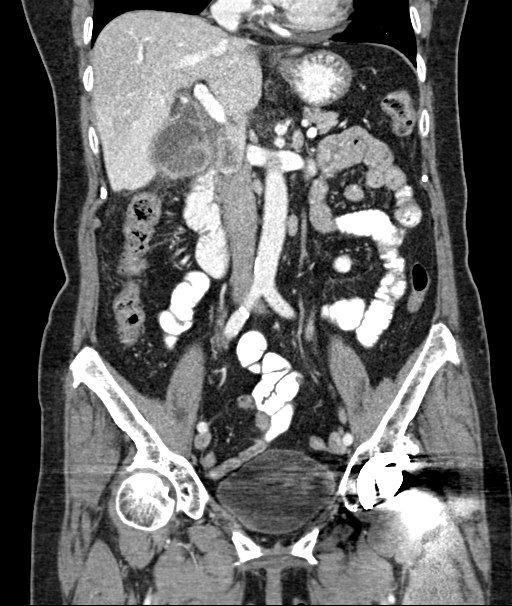
[im 71/127  soft-tissue]
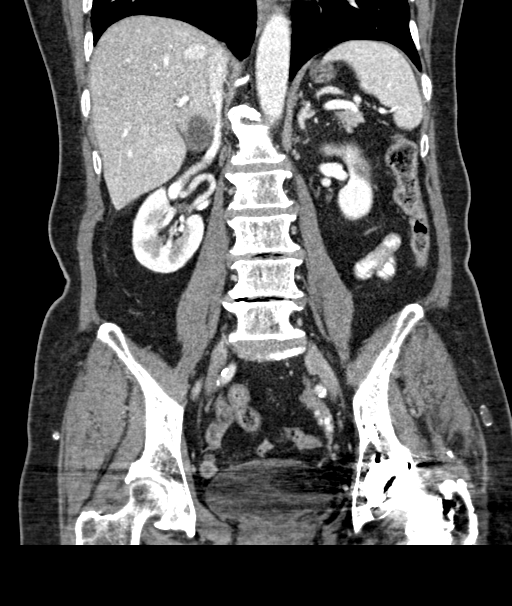

[15 of 46 positions shown; findings below may reference images not displayed]

FINDINGS: Lower chest: The lung bases are clear. The heart size is normal.
There are multiple masses that appear to be centered within the left
breast tissue. The largest measures approximately 2.6 cm.

Hepatobiliary: The liver is normal. The gallbladder is distended
with some mild gallbladder wall thickening. There is likely
gallbladder sludge.There is no biliary ductal dilation.

Pancreas: Normal contours without ductal dilatation. No
peripancreatic fluid collection.

Spleen: No splenic laceration or hematoma.

Adrenals/Urinary Tract:

--Adrenal glands: No adrenal hemorrhage.

--Right kidney/ureter: There is some mild cortical thinning of the
upper pole the right kidney.

--Left kidney/ureter: No hydronephrosis or perinephric hematoma.

--Urinary bladder: Unremarkable.

Stomach/Bowel:

--Stomach/Duodenum: No hiatal hernia or other gastric abnormality.
Normal duodenal course and caliber.

--Small bowel: No dilatation or inflammation.

--Colon: No focal abnormality.

--Appendix: Not visualized. No right lower quadrant inflammation or
free fluid.

Vascular/Lymphatic: Atherosclerotic calcification is present within
the non-aneurysmal abdominal aorta, without hemodynamically
significant stenosis.

--are no pathologically enlarged retroperitoneal lymph nodes. There
is a 3.2 cm heterogeneous mass adjacent to the pancreatic body and
left hepatic lobe. There is an additional 2.5 cm heterogeneous mass
adjacent to the pancreatic head.

--No mesenteric lymphadenopathy.

--No pelvic or inguinal lymphadenopathy.

Reproductive: Unremarkable

Other: No ascites or free air. The abdominal wall is normal.

Musculoskeletal. There is a unilateral pars defect at L5 on the left
without evidence for significant anterolisthesis. The patient is
status post total hip arthroplasty on the left.
IMPRESSION: 1. Distended gallbladder with gallbladder wall thickening raising
concern for acute cholecystitis. Follow-up with ultrasound is
recommended.
2. Multiple masses are noted in the left breast tissue. Findings are
concerning for underlying breast carcinoma. Correlation with
outpatient mammographies recommended.
3. A 3.2 cm and a 2.5 cm mass are noted adjacent to the pancreatic
body and head. Findings are concerning for pathologically enlarged
lymph nodes. Nodal metastatic disease is not excluded. Follow-up is
recommended.
